# Patient Record
Sex: Female | Born: 1975 | Hispanic: No | Marital: Married | Smoking: Never smoker
Health system: Southern US, Community
[De-identification: ages and names within clinical notes are randomized; demographics above are authoritative.]

## PROBLEM LIST (undated history)

## (undated) DIAGNOSIS — N643 Galactorrhea not associated with childbirth: Secondary | ICD-10-CM

## (undated) DIAGNOSIS — R110 Nausea: Secondary | ICD-10-CM

## (undated) DIAGNOSIS — R42 Dizziness and giddiness: Secondary | ICD-10-CM

## (undated) DIAGNOSIS — Z87442 Personal history of urinary calculi: Secondary | ICD-10-CM

## (undated) DIAGNOSIS — R0602 Shortness of breath: Secondary | ICD-10-CM

## (undated) DIAGNOSIS — J45909 Unspecified asthma, uncomplicated: Secondary | ICD-10-CM

## (undated) DIAGNOSIS — D649 Anemia, unspecified: Secondary | ICD-10-CM

## (undated) DIAGNOSIS — R319 Hematuria, unspecified: Secondary | ICD-10-CM

## (undated) DIAGNOSIS — R87619 Unspecified abnormal cytological findings in specimens from cervix uteri: Secondary | ICD-10-CM

## (undated) DIAGNOSIS — N949 Unspecified condition associated with female genital organs and menstrual cycle: Secondary | ICD-10-CM

## (undated) DIAGNOSIS — L290 Pruritus ani: Secondary | ICD-10-CM

## (undated) DIAGNOSIS — E229 Hyperfunction of pituitary gland, unspecified: Secondary | ICD-10-CM

## (undated) DIAGNOSIS — IMO0002 Reserved for concepts with insufficient information to code with codable children: Secondary | ICD-10-CM

## (undated) HISTORY — DX: Personal history of urinary calculi: Z87.442

## (undated) HISTORY — DX: Nausea: R11.0

## (undated) HISTORY — DX: Unspecified asthma, uncomplicated: J45.909

## (undated) HISTORY — DX: Unspecified abnormal cytological findings in specimens from cervix uteri: R87.619

## (undated) HISTORY — DX: Anemia, unspecified: D64.9

## (undated) HISTORY — DX: Dizziness and giddiness: R42

## (undated) HISTORY — DX: Pruritus ani: L29.0

## (undated) HISTORY — DX: Hematuria, unspecified: R31.9

## (undated) HISTORY — DX: Hyperfunction of pituitary gland, unspecified: E22.9

## (undated) HISTORY — DX: Galactorrhea not associated with childbirth: N64.3

## (undated) HISTORY — DX: Reserved for concepts with insufficient information to code with codable children: IMO0002

## (undated) HISTORY — PX: DILATION AND CURETTAGE OF UTERUS: SHX78

## (undated) HISTORY — DX: Shortness of breath: R06.02

## (undated) HISTORY — DX: Unspecified condition associated with female genital organs and menstrual cycle: N94.9

---

## 2002-04-04 ENCOUNTER — Other Ambulatory Visit: Admission: RE | Admit: 2002-04-04 | Discharge: 2002-04-04 | Payer: Self-pay | Admitting: Obstetrics and Gynecology

## 2002-07-13 DIAGNOSIS — N643 Galactorrhea not associated with childbirth: Secondary | ICD-10-CM

## 2002-07-13 DIAGNOSIS — R7989 Other specified abnormal findings of blood chemistry: Secondary | ICD-10-CM

## 2002-07-13 HISTORY — DX: Galactorrhea not associated with childbirth: N64.3

## 2002-07-13 HISTORY — DX: Other specified abnormal findings of blood chemistry: R79.89

## 2002-07-29 ENCOUNTER — Emergency Department (HOSPITAL_COMMUNITY): Admission: EM | Admit: 2002-07-29 | Discharge: 2002-07-29 | Payer: Self-pay | Admitting: Emergency Medicine

## 2002-07-29 ENCOUNTER — Encounter: Payer: Self-pay | Admitting: Emergency Medicine

## 2002-08-03 ENCOUNTER — Ambulatory Visit (HOSPITAL_BASED_OUTPATIENT_CLINIC_OR_DEPARTMENT_OTHER): Admission: RE | Admit: 2002-08-03 | Discharge: 2002-08-03 | Payer: Self-pay | Admitting: Urology

## 2003-04-05 ENCOUNTER — Other Ambulatory Visit: Admission: RE | Admit: 2003-04-05 | Discharge: 2003-04-05 | Payer: Self-pay | Admitting: Obstetrics and Gynecology

## 2003-07-14 DIAGNOSIS — R42 Dizziness and giddiness: Secondary | ICD-10-CM

## 2003-07-14 DIAGNOSIS — R0602 Shortness of breath: Secondary | ICD-10-CM

## 2003-07-14 DIAGNOSIS — R110 Nausea: Secondary | ICD-10-CM

## 2003-07-14 HISTORY — DX: Dizziness and giddiness: R42

## 2003-07-14 HISTORY — DX: Nausea: R11.0

## 2003-07-14 HISTORY — DX: Shortness of breath: R06.02

## 2003-10-03 ENCOUNTER — Other Ambulatory Visit: Admission: RE | Admit: 2003-10-03 | Discharge: 2003-10-03 | Payer: Self-pay | Admitting: Obstetrics and Gynecology

## 2004-04-09 ENCOUNTER — Other Ambulatory Visit: Admission: RE | Admit: 2004-04-09 | Discharge: 2004-04-09 | Payer: Self-pay | Admitting: Obstetrics and Gynecology

## 2004-07-13 DIAGNOSIS — N9489 Other specified conditions associated with female genital organs and menstrual cycle: Secondary | ICD-10-CM

## 2004-07-13 DIAGNOSIS — N949 Unspecified condition associated with female genital organs and menstrual cycle: Secondary | ICD-10-CM

## 2004-07-13 HISTORY — DX: Unspecified condition associated with female genital organs and menstrual cycle: N94.9

## 2004-07-13 HISTORY — DX: Other specified conditions associated with female genital organs and menstrual cycle: N94.89

## 2005-04-15 ENCOUNTER — Other Ambulatory Visit: Admission: RE | Admit: 2005-04-15 | Discharge: 2005-04-15 | Payer: Self-pay | Admitting: Obstetrics and Gynecology

## 2005-06-19 ENCOUNTER — Ambulatory Visit: Payer: Self-pay | Admitting: Gastroenterology

## 2005-07-31 ENCOUNTER — Ambulatory Visit: Payer: Self-pay | Admitting: Cardiology

## 2006-04-27 ENCOUNTER — Other Ambulatory Visit: Admission: RE | Admit: 2006-04-27 | Discharge: 2006-04-27 | Payer: Self-pay | Admitting: Obstetrics and Gynecology

## 2007-01-23 IMAGING — CT CT ABDOMEN W/ CM
2 of 5 series · 16 of 46 positions shown, 18 images · IV contrast (APPLIED)
Comparison: Unenhanced CT abdomen and pelvis 07/29/2002.

CLINICAL DATA: Mid abdominal pain extending into the back for the past 7-8
months. History of urinary tract calculi. 17 pounds weight loss in one year.

CT ABDOMEN AND PELVIS WITH CONTRAST 07/31/2005:
TECHNIQUE: Multidetector helical CT of the abdomen and pelvis was performed
during bolus administration of intravenous contrast. Oral contrast was given.
Delayed imaging through the kidneys was performed.
Contrast:  100 cc Omnipaque 300.

[Series 2: abd_pel 5.0 b30f st · axial · 0.62mm/px · z∈[-422,-66]mm · 13 of 81 slices shown, 15 images]
[im 5/81  soft-tissue]
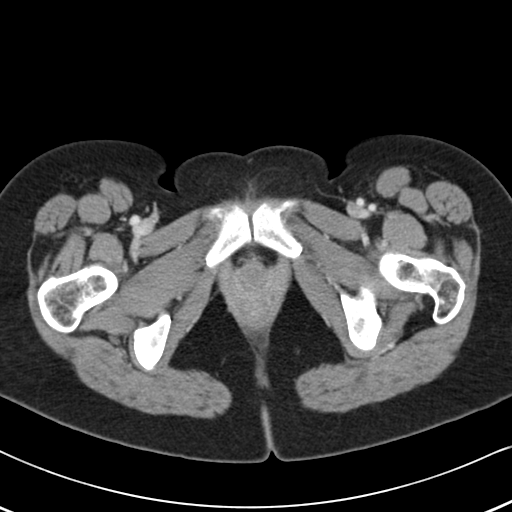
[im 5/81  bone]
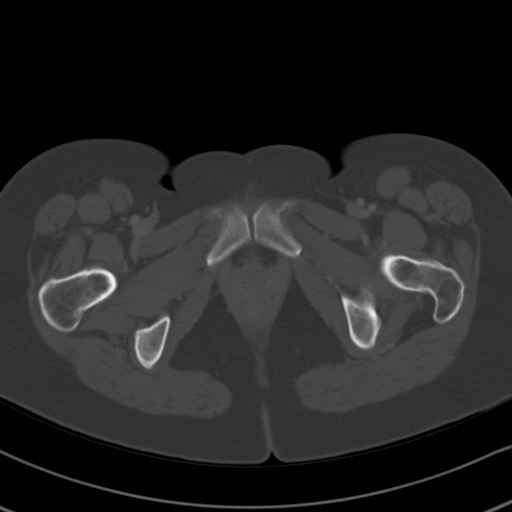
[im 13/81  soft-tissue]
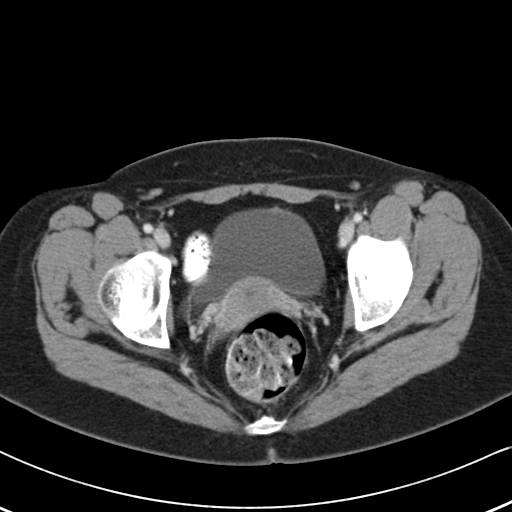
[im 17/81  soft-tissue]
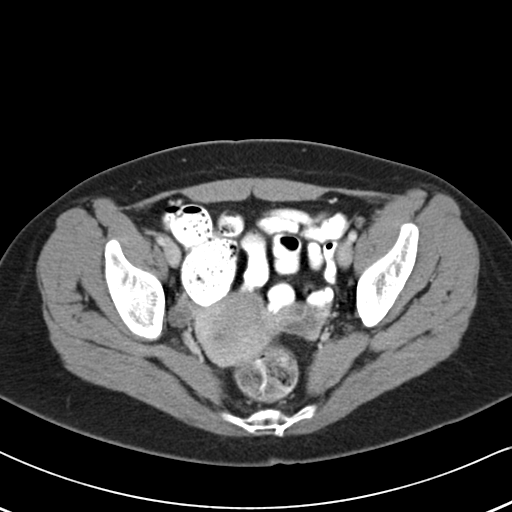
[im 22/81  soft-tissue]
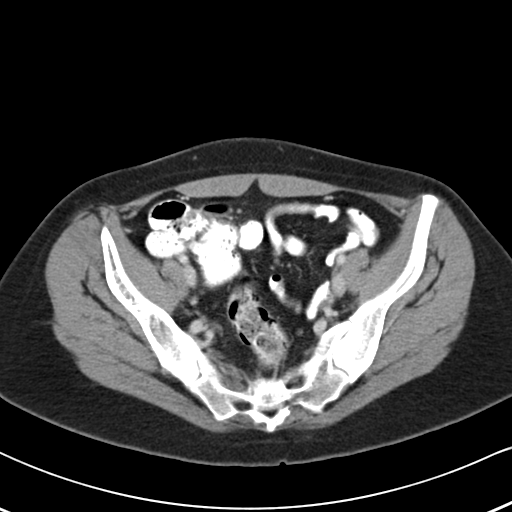
[im 30/81  soft-tissue]
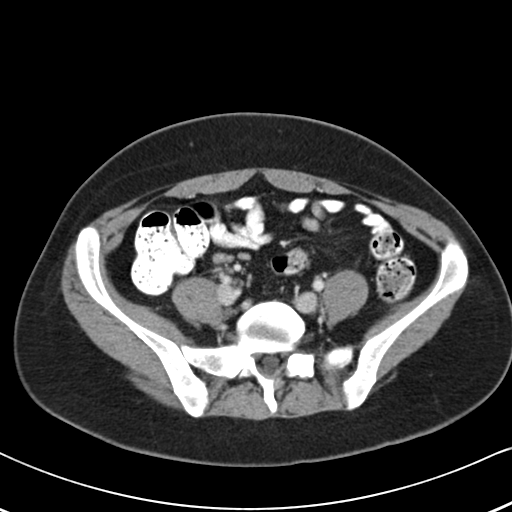
[im 34/81  soft-tissue]
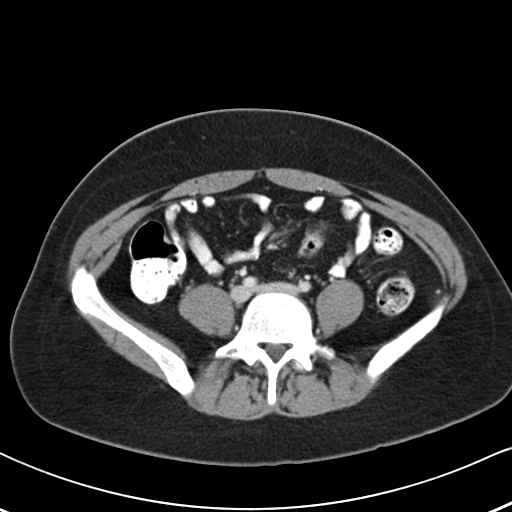
[im 43/81  soft-tissue]
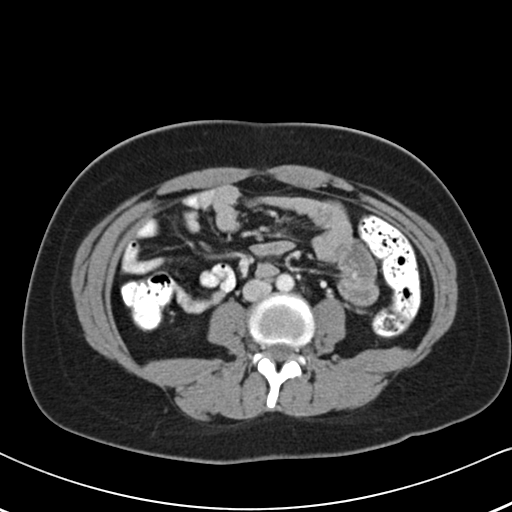
[im 47/81  soft-tissue]
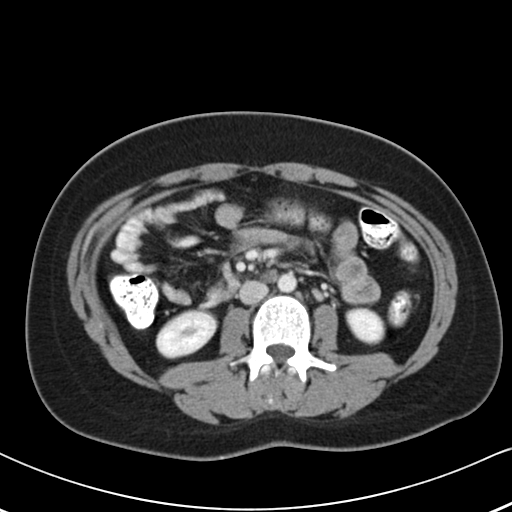
[im 51/81  soft-tissue]
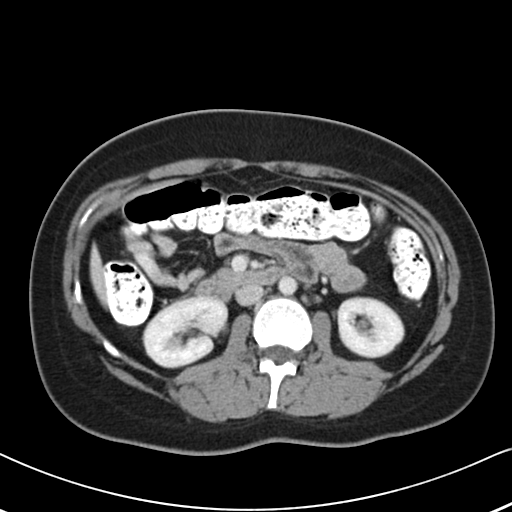
[im 51/81  bone]
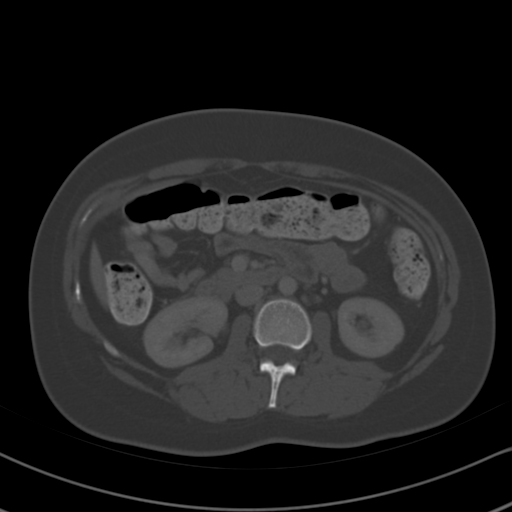
[im 59/81  soft-tissue]
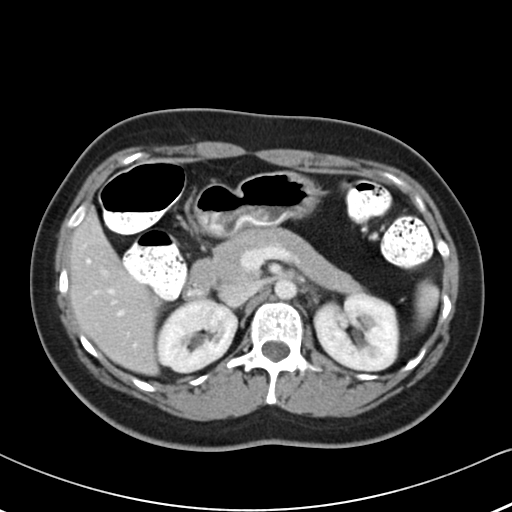
[im 64/81  soft-tissue]
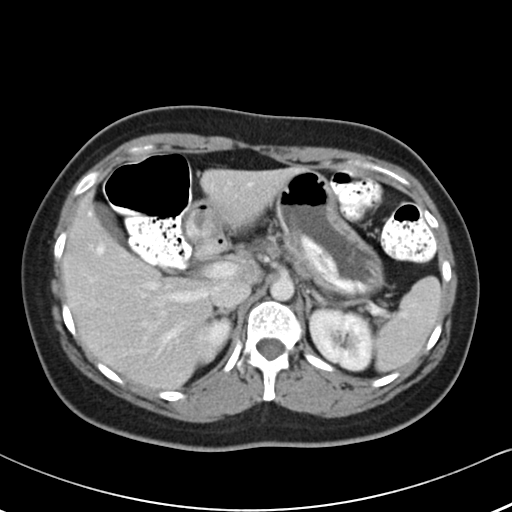
[im 68/81  soft-tissue]
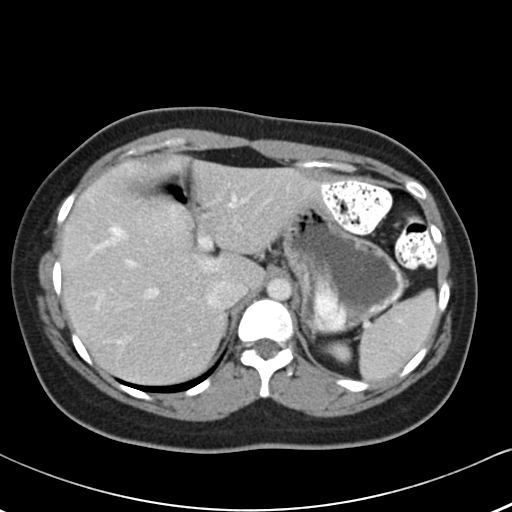
[im 76/81  soft-tissue]
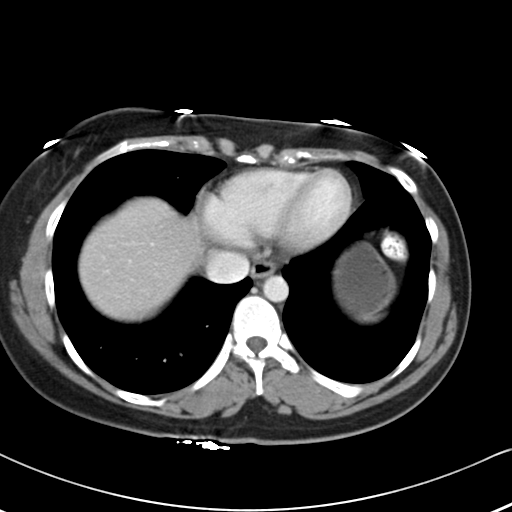

[Series 602: <mpr range> · coronal · 0.82mm/px · 3 of 39 slices shown]
[im 13/39  soft-tissue]
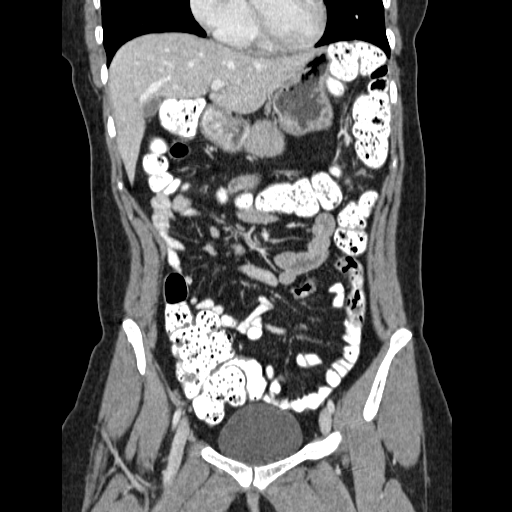
[im 17/39  soft-tissue]
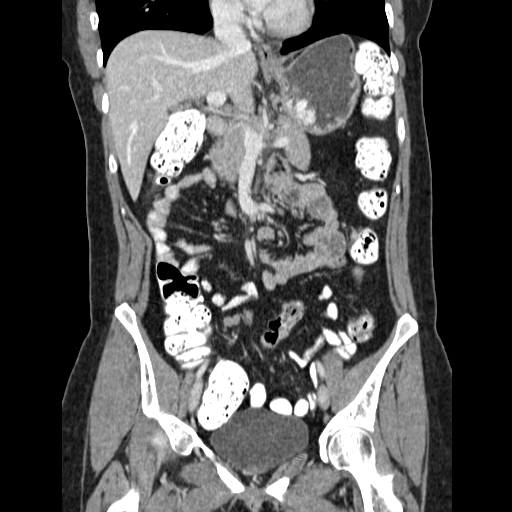
[im 22/39  soft-tissue]
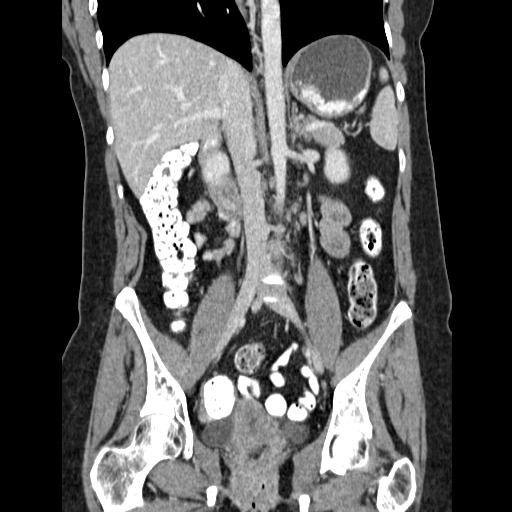

[16 of 46 positions shown; findings below may reference images not displayed]

CT ABDOMEN:

The liver is normal in size and contains an approximately 4 mm low attenuation
lesion in the posterior segment right lobe near its inferior tip; this likely
represents a small cyst, given its conspicuity for its small size. The spleen,
pancreas, and adrenal glands are normal. The gallbladder is contracted but
otherwise unremarkable and there is no biliary ductal dilation. No upper urinary
tract calculi are identified and both kidneys are normal in appearance. The
stomach and visualized colon and small bowel are unremarkable in the upper
abdomen. There is no free fluid. The abdominal aorta is normal in appearance.
There are scattered mildly enlarged retroperitoneal lymph nodes, the largest
measuring approximately 1.6 x 0.8 cm in the left periaortic region at the level
of the renal hilum. No significant lymphadenopathy is identified. The visualized
lung bases are clear.
IMPRESSION: No significant abnormalities. 4 mm lesion in the posterior segment right lobe of
liver likely a small cyst. Scattered subcentimeter short axis lymph nodes in the
retroperitoneum, nonspecific, likely reactive.

CT PELVIS:

The uterus is retroverted but otherwise normal in appearance. Bilateral ovarian
cysts are present consistent with age. No adnexal masses or free fluid are
identified. Urinary bladder is normal. The colon and small bowel are
unremarkable. There is no significant lymphadenopathy.
IMPRESSION: Normal CT of the pelvis.

## 2011-11-11 ENCOUNTER — Ambulatory Visit (INDEPENDENT_AMBULATORY_CARE_PROVIDER_SITE_OTHER): Payer: BC Managed Care – HMO | Admitting: Obstetrics and Gynecology

## 2011-11-11 ENCOUNTER — Encounter: Payer: Self-pay | Admitting: Obstetrics and Gynecology

## 2011-11-11 VITALS — BP 96/56 | Wt 138.0 lb

## 2011-11-11 DIAGNOSIS — Z87442 Personal history of urinary calculi: Secondary | ICD-10-CM

## 2011-11-11 DIAGNOSIS — IMO0001 Reserved for inherently not codable concepts without codable children: Secondary | ICD-10-CM | POA: Insufficient documentation

## 2011-11-11 DIAGNOSIS — J45909 Unspecified asthma, uncomplicated: Secondary | ICD-10-CM

## 2011-11-11 DIAGNOSIS — Z309 Encounter for contraceptive management, unspecified: Secondary | ICD-10-CM

## 2011-11-11 DIAGNOSIS — R109 Unspecified abdominal pain: Secondary | ICD-10-CM

## 2011-11-11 DIAGNOSIS — D649 Anemia, unspecified: Secondary | ICD-10-CM | POA: Insufficient documentation

## 2011-11-11 LAB — CBC
HCT: 39.2 % (ref 36.0–46.0)
Hemoglobin: 12.1 g/dL (ref 12.0–15.0)
MCH: 26.1 pg (ref 26.0–34.0)
MCHC: 30.9 g/dL (ref 30.0–36.0)
MCV: 84.5 fL (ref 78.0–100.0)
Platelets: 361 10*3/uL (ref 150–400)
RBC: 4.64 MIL/uL (ref 3.87–5.11)
RDW: 14.9 % (ref 11.5–15.5)
WBC: 14.2 10*3/uL — ABNORMAL HIGH (ref 4.0–10.5)

## 2011-11-11 LAB — POCT URINALYSIS DIPSTICK
Bilirubin, UA: NEGATIVE
Blood, UA: NEGATIVE
Glucose, UA: NEGATIVE
Ketones, UA: NEGATIVE
Leukocytes, UA: NEGATIVE
Nitrite, UA: NEGATIVE
Protein, UA: NEGATIVE
Spec Grav, UA: <=1.005
Urobilinogen, UA: NEGATIVE
pH, UA: 8

## 2011-11-11 MED ORDER — NORETHINDRONE ACET-ETHINYL EST 1-20 MG-MCG PO TABS: 1.0000 | ORAL_TABLET | Freq: Every day | ORAL | Status: DC

## 2011-11-11 NOTE — Progress Notes (Deleted)
[name] is a 37 y.o. year old female,No obstetric history on file., who presents for a problem visit.  Subjective:  ***  Objective:  BP 96/56  Wt 138 lb (62.596 kg)  LMP 10/20/2011   {Physical ZOXW:9604540}  {Exam; pelvic:30843}  Assessment:  ***  Plan:  ***  Return to office {Return to clinic:15760}   Leonard Schwartz M.D.  11/11/2011 9:58 AM

## 2011-11-11 NOTE — Progress Notes (Signed)
[name] is a 36 y.o. year old female,No obstetric history on file., who presents for a problem visit.  Subjective:  The patient presents complaining of a vague abdominal discomfort.  Her GU function and GI function are normal at this point.  She has a new significant other.  She is not sexually active.  She does want to discuss contraception.  She does want to have children in the future.  She has a history of anemia and is currently taking iron.  Objective:  BP 96/56  Wt 138 lb (62.596 kg)  LMP 10/20/2011   UA: Negative  Exam deferred.  Assessment:  History of anemia  Take abdominal pain  Contraception  Plan:  CBC sent.  Contraceptive options reviewed.  Risk and benefits discussed.  The patient was to began Loestrin oral contraceptives 1.0/20.  Return to office prn if symptoms worsen or fail to improve. Return for annual exam as long as she is doing well.  Call for questions or concerns.   Leonard Schwartz M.D.  11/11/2011 7:32 PM

## 2011-11-11 NOTE — Progress Notes (Signed)
Addended by: Janine Limbo on: 11/11/2011 07:39 PM   Modules accepted: Orders

## 2011-11-13 LAB — URINE CULTURE: Colony Count: 8000

## 2012-02-15 ENCOUNTER — Encounter: Payer: BC Managed Care – HMO | Admitting: Obstetrics and Gynecology

## 2012-04-05 ENCOUNTER — Ambulatory Visit (INDEPENDENT_AMBULATORY_CARE_PROVIDER_SITE_OTHER): Payer: BC Managed Care – HMO | Admitting: Obstetrics and Gynecology

## 2012-04-05 ENCOUNTER — Encounter: Payer: Self-pay | Admitting: Obstetrics and Gynecology

## 2012-04-05 VITALS — BP 98/70 | Resp 14 | Ht 62.0 in | Wt 142.0 lb

## 2012-04-05 DIAGNOSIS — R350 Frequency of micturition: Secondary | ICD-10-CM

## 2012-04-05 DIAGNOSIS — N898 Other specified noninflammatory disorders of vagina: Secondary | ICD-10-CM

## 2012-04-05 DIAGNOSIS — N949 Unspecified condition associated with female genital organs and menstrual cycle: Secondary | ICD-10-CM

## 2012-04-05 MED ORDER — SULFAMETHOXAZOLE-TMP DS 800-160 MG PO TABS: 1.0000 | ORAL_TABLET | Freq: Two times a day (BID) | ORAL | Status: DC

## 2012-04-05 MED ORDER — FLUCONAZOLE 150 MG PO TABS: 150.0000 mg | ORAL_TABLET | Freq: Every day | ORAL | Status: DC

## 2012-04-05 NOTE — Progress Notes (Signed)
HISTORY OF PRESENT ILLNESS  Ms. Janiqua Friscia is a 36 y.o. year old female,G0P0, who presents for a problem visit. The patient complains of urinary frequency.  She denies dysuria and hematuria.  She does have a history of kidney stones.  She has started birth control pills and seems to be doing well.  She is recently married.  Her hemoglobin was normal in May 2013.  Her white blood cell count was greater than 14,000.  She does have a history of sinus allergies.  Subjective:  The patient reports that she has had difficulty with sinus congestion.  She frequently gets a yeast infection after taking antibiotics.  She complains of "passing gas" from the vagina.  She complains of a vaginal odor.  There is no discharge.  She declined a pelvic exam.  Objective:  BP 98/70  Resp 14  Ht 5\' 2"  (1.575 m)  Wt 142 lb (64.411 kg)  BMI 25.97 kg/m2  LMP 03/30/2012   General: no distress Resp: clear to auscultation bilaterally Cardio: regular rate and rhythm, S1, S2 normal, no murmur, click, rub or gallop GI: soft and nontender HEENT: Congested sinuses.  Throat is clear.  Neck is nontender.  No adenopathy.  Exam deferred.  Assessment:  Urinary frequency Sinus congestion Seasonal allergies Elevated white blood cell count "Passing gas" and odor from the vagina  Plan:  The patient declines a pelvic exam today.  She will observe only for now. Septra DS, one tablet twice each day for 3 days. Diflucan 150 mg, one tablet if needed for yeast infection. If the patient's seasonal allergies have subsided, then we will repeat her white blood cell count at her next visit.  Return to office in 2 month(s).   Leonard Schwartz M.D.  04/05/2012 6:17 PM

## 2012-04-27 ENCOUNTER — Other Ambulatory Visit: Payer: Self-pay | Admitting: Obstetrics and Gynecology

## 2012-05-17 ENCOUNTER — Ambulatory Visit (INDEPENDENT_AMBULATORY_CARE_PROVIDER_SITE_OTHER): Payer: BC Managed Care – HMO | Admitting: Obstetrics and Gynecology

## 2012-05-17 ENCOUNTER — Encounter: Payer: Self-pay | Admitting: Obstetrics and Gynecology

## 2012-05-17 VITALS — BP 90/60 | Wt 144.0 lb

## 2012-05-17 DIAGNOSIS — N390 Urinary tract infection, site not specified: Secondary | ICD-10-CM

## 2012-05-17 DIAGNOSIS — IMO0002 Reserved for concepts with insufficient information to code with codable children: Secondary | ICD-10-CM

## 2012-05-17 DIAGNOSIS — D75839 Thrombocytosis, unspecified: Secondary | ICD-10-CM

## 2012-05-17 DIAGNOSIS — D473 Essential (hemorrhagic) thrombocythemia: Secondary | ICD-10-CM

## 2012-05-17 DIAGNOSIS — R102 Pelvic and perineal pain: Secondary | ICD-10-CM

## 2012-05-17 DIAGNOSIS — N949 Unspecified condition associated with female genital organs and menstrual cycle: Secondary | ICD-10-CM

## 2012-05-17 LAB — POCT URINALYSIS DIPSTICK
Glucose, UA: NEGATIVE
Nitrite, UA: NEGATIVE
Protein, UA: NEGATIVE
Urobilinogen, UA: NEGATIVE

## 2012-05-17 LAB — CBC
MCH: 27.1 pg (ref 26.0–34.0)
Platelets: 381 10*3/uL (ref 150–400)
RBC: 4.35 MIL/uL (ref 3.87–5.11)
RDW: 13.8 % (ref 11.5–15.5)
WBC: 14.8 10*3/uL — ABNORMAL HIGH (ref 4.0–10.5)

## 2012-05-17 MED ORDER — PHENAZOPYRIDINE HCL 200 MG PO TABS: 200.0000 mg | ORAL_TABLET | Freq: Three times a day (TID) | ORAL | Status: DC | PRN

## 2012-05-17 NOTE — Addendum Note (Signed)
Addended by: Loralyn Freshwater on: 05/17/2012 05:31 PM   Modules accepted: Orders

## 2012-05-17 NOTE — Progress Notes (Signed)
HISTORY OF PRESENT ILLNESS  [name] is a 36 y.o. year old female,G0P0, who presents for a problem visit. The patient has recently married.  She complains of dysuria and dyspareunia.  She was treated with Septra DS for her discomfort continues.  The patient also complains of "gas" from the vagina not associated with movements or intercourse.  The patient had an elevated white blood cell count of uncertain etiology.  Subjective:  See above  Objective:  BP 90/60  Wt 144 lb (65.318 kg)  LMP 04/27/2012   General: no distress GI: nontender, no masses  External genitalia: normal general appearance Vaginal: tender anterior vagina at the bladder Cervix: normal appearance Adnexa: normal bimanual exam Uterus: normal size, nontender  UA: Negative  Assessment:  Dysuria  Dyspareunia  Thrombocytosis  "Gas" from the vagina of uncertain etiology  Plan:  Urine culture.  CBC.  The patient will try Pyridium to see if that makes her bladder filled better.  I will call the patient with her laboratory tests results and discuss prophylactic nitrofurantoin.  Return to office prn if symptoms worsen or fail to improve.   Leonard Schwartz M.D.  05/17/2012 5:24 PM

## 2012-05-19 LAB — URINE CULTURE: Colony Count: NO GROWTH

## 2012-06-20 ENCOUNTER — Encounter: Payer: Self-pay | Admitting: Obstetrics and Gynecology

## 2012-06-20 ENCOUNTER — Ambulatory Visit (INDEPENDENT_AMBULATORY_CARE_PROVIDER_SITE_OTHER): Payer: BC Managed Care – HMO | Admitting: Obstetrics and Gynecology

## 2012-06-20 ENCOUNTER — Telehealth: Payer: Self-pay

## 2012-06-20 VITALS — Ht 63.0 in | Wt 140.0 lb

## 2012-06-20 DIAGNOSIS — Z01419 Encounter for gynecological examination (general) (routine) without abnormal findings: Secondary | ICD-10-CM

## 2012-06-20 DIAGNOSIS — Z124 Encounter for screening for malignant neoplasm of cervix: Secondary | ICD-10-CM

## 2012-06-20 MED ORDER — NORETHIN ACE-ETH ESTRAD-FE 1-20 MG-MCG PO TABS: 1.0000 | ORAL_TABLET | Freq: Every day | ORAL | Status: DC

## 2012-06-20 NOTE — Progress Notes (Signed)
ANNUAL GYNECOLOGIC EXAMINATION   Brittany Thornton is a 36 y.o. female, G0P0, who presents for an annual exam. The patient has a history of dyspareunia that is getting better.  She is recently married.  She has a history of kidney stones but is having no problems.  She may want to get pregnant in the next couple of months.   History   Social History  . Marital Status: Married    Spouse Name: N/A    Number of Children: N/A  . Years of Education: N/A   Social History Main Topics  . Smoking status: Never Smoker   . Smokeless tobacco: Never Used  . Alcohol Use: None  . Drug Use: No  . Sexually Active: Yes    Birth Control/ Protection: Pill   Other Topics Concern  . None   Social History Narrative  . None    Menstrual cycle:   LMP: Patient's last menstrual period was 05/27/2012.             The following portions of the patient's history were reviewed and updated as appropriate: allergies, current medications, past family history, past medical history, past social history, past surgical history and problem list.  Review of Systems Pertinent items are noted in HPI. Breast:Negative for breast lump,nipple discharge or nipple retraction Gastrointestinal: Negative for abdominal pain, change in bowel habits or rectal bleeding Urinary:negative   Objective:    Ht 5\' 3"  (1.6 m)  Wt 140 lb (63.504 kg)  BMI 24.80 kg/m2  LMP 05/27/2012    Weight:  Wt Readings from Last 1 Encounters:  06/20/12 140 lb (63.504 kg)          BMI: Body mass index is 24.80 kg/(m^2).  General Appearance: Alert, appropriate appearance for age. No acute distress HEENT: Grossly normal Neck / Thyroid: Supple, no masses, nodes or enlargement Lungs: clear to auscultation bilaterally Back: No CVA tenderness Breast Exam: No masses or nodes.No dimpling, nipple retraction or discharge. Cardiovascular: Regular rate and rhythm. S1, S2, no murmur Gastrointestinal: Soft, non-tender, no masses or  organomegaly  ++++++++++++++++++++++++++++++++++++++++++++++++++++++++  Pelvic Exam: External genitalia: normal general appearance Vaginal: normal without tenderness, induration or masses. Relaxation: No Cervix: normal appearance Adnexa: normal bimanual exam Uterus: normal size, shape, and consistency Rectovaginal: normal rectal, no masses  ++++++++++++++++++++++++++++++++++++++++++++++++++++++++  Lymphatic Exam: Non-palpable nodes in neck, clavicular, axillary, or inguinal regions Neurologic: Normal speech, no tremor  Psychiatric: Alert and oriented, appropriate affect.  Assessment:    Normal gyn exam   Overweight or obese: Yes   Pelvic relaxation: No  Dyspareunia that is getting better  History of kidney stones   Plan:    pap smear return annually or prn Contraception:oral contraceptives (estrogen/progesterone)    Medications prescribed: Begin prenatal vitamins  STD screen request: No   The updated Pap smear screening guidelines were discussed with the patient. The patient requested that I obtain a Pap smear: Yes.  Kegel exercises discussed: No.  Proper diet and regular exercise were reviewed.  Annual mammograms recommended starting at age 8. Proper breast care was discussed.  Screening colonoscopy is recommended beginning at age 87.  Regular health maintenance was reviewed.  Sleep hygiene was discussed.  Adequate calcium and vitamin D intake was emphasized.  Leonard Schwartz M.D.    Regular Periods: yes Mammogram: no  Monthly Breast Ex.: yes Exercise: yes  Tetanus < 10 years: Unsure Seatbelts: yes  NI. Bladder Functn.: yes Abuse at home: no  Daily BM's: yes Stressful Work: no  Healthy Diet:  yes Sigmoid-Colonoscopy: n/a  Calcium: no Medical problems this year: none   LAST PAP: 1103/2010  Contraception: Junel FE  Mammogram:  N/a  PCP: Deatra James  PMH: None  FMH: None  Last Bone Scan: None

## 2012-06-21 LAB — PAP IG, CT-NG, RFX HPV ASCU

## 2012-06-21 NOTE — Telephone Encounter (Signed)
error 

## 2013-05-16 ENCOUNTER — Encounter: Payer: Self-pay | Admitting: Internal Medicine

## 2013-05-16 ENCOUNTER — Ambulatory Visit (INDEPENDENT_AMBULATORY_CARE_PROVIDER_SITE_OTHER): Payer: BC Managed Care – PPO | Admitting: Internal Medicine

## 2013-05-16 VITALS — BP 118/78 | HR 72 | Temp 98.1°F | Ht 62.0 in | Wt 143.2 lb

## 2013-05-16 DIAGNOSIS — J45909 Unspecified asthma, uncomplicated: Secondary | ICD-10-CM

## 2013-05-16 DIAGNOSIS — R942 Abnormal results of pulmonary function studies: Secondary | ICD-10-CM

## 2013-05-16 NOTE — Progress Notes (Signed)
Subjective:    Patient ID: Brittany Thornton, female    DOB: [birth year]   MRN: [id number]  HPI   54 yobf from [place] went to college in [place] where noted onset in her 56s of variable cough productive of green mucus at time and poor ex tolerance which persisted ever since referred Dr Katrinka Blazing because of abdnormal pft's ? Restrictive / ? Also whether has asthma  05/16/2013 [facility] office visit/ Nami Strawder cc variable cough worse spring and fall and although "I've always had it" does not have it at ov and not taking meds for asthma for over a month s flare.   Doe x jogging.     Symptoms are some better on nasonex asthmanex but never completely relieved (except doen't have it now and has not had it for a month)  After about 15 min of not following the words "always" and " constant"  It turns out that most of her respiratory  problems are purely seasonal, have already been evaluated by [facility], and the only resp  symptom that is not seasonal is the doe jogging to which I asked "when did you last jog, is this something you do regularly?" she responded "I never do"  No obvious day to day or daytime variabilty or assoc chronic cough or cp or chest tightness, subjective wheeze overt sinus or hb symptoms. No unusual exp hx or h/o childhood pna/ asthma or knowledge of premature birth.  Sleeping ok without nocturnal  or early am exacerbation  of respiratory  c/o's or need for noct saba. Also denies any obvious fluctuation of symptoms with weather or environmental changes or other aggravating or alleviating factors except as outlined above   Current Medications, Allergies, Complete Past Medical History, Past Surgical History, Family History, and Social History were reviewed in [employer] record.          Review of Systems  Constitutional: Negative for fever, chills and unexpected weight change.  HENT: Positive for congestion, sneezing and sore throat. Negative for dental  problem, ear pain, nosebleeds, postnasal drip, rhinorrhea, sinus pressure, trouble swallowing and voice change.   Eyes: Negative for visual disturbance.  Respiratory: Positive for cough and shortness of breath. Negative for choking.   Cardiovascular: Negative for chest pain and leg swelling.  Gastrointestinal: Negative for vomiting, abdominal pain and diarrhea.  Genitourinary: Negative for difficulty urinating.  Musculoskeletal: Negative for arthralgias.  Skin: Negative for rash.  Neurological: Positive for headaches. Negative for tremors and syncope.  Hematological: Does not bruise/bleed easily.       Objective:   Physical Exam  amb bf nad  Quite pleasant but  failed to answer a single question asked in a straightforward manner, tending  answer questions with ambiguous medical terms or diagnoses and seemed aggravated  when asked the same question more than once for clarification "I've never been asked so many questions by a doctor"   HEENT: nl dentition, turbinates, and orophanx. Nl external ear canals without cough reflex   NECK :  without JVD/Nodes/TM/ nl carotid upstrokes bilaterally   LUNGS: no acc muscle use, clear to A and P bilaterally without cough on insp or exp maneuvers   CV:  RRR  no s3 or murmur or increase in P2, no edema   ABD:  soft and nontender with nl excursion in the supine position. No bruits or organomegaly, bowel sounds nl  MS:  warm without deformities, calf tenderness, cyanosis or clubbing  SKIN: warm and dry without lesions  NEURO:  alert, approp, no deficits     Spirometry 04/28/13  No airflow obstruction at all (pt reports this was off asthmanex and albuterol and while feelling fine, though ov notes suggest otherwise     Assessment & Plan:

## 2013-05-16 NOTE — Patient Instructions (Signed)
Your function is normal when obtained on a day when you had no symptoms so you don't  have a permanent lung problem  Your response to inhalers suggests asthma but it should be easier to control than what you are describing suggesting you might need a different asthma medicaiton  The asthmanex is a good choice but takes weeks to work - a better option might be dulera 100 dosed 2 puffs every 12 hours during your bad seasons  Keep the appointment with Dr Brittany Thornton and pulmonary follow up can be as needed

## 2013-05-16 NOTE — Assessment & Plan Note (Signed)
She has very minimal restrictive changes c/w body habitus, so no need for further w/u

## 2013-05-16 NOTE — Assessment & Plan Note (Addendum)
This is an exceptionally challenging pt who probably does have asthma that is seasonal and manifested by cough and using asthmanex prn, not maintenance.  It is so difficult to get an accurate history that I can't be sure  That what   she's describing as a seasonal cough "she always has" represents asthma but it does seem some better on asthmanex.  Because the asthmanex is so slow to start working and she is not likely to be convinced to take as maint rx a good alternative would be the use of seasonal dulera 100 dosed 2 every 12 hours but I would defer rx and f/u to Dr Lucie Leather  With whom she already has a scheduled appt this month.  Unless she has persistent airflow obst (not present on pft's from 04/28/13) or a specific desired activity where breathing limits her, there is no reason for pulmonary f/u.  She seemed very irritated with all the questions I asked her so I explained to her:  Unlike when you get a prescription for eyeglasses, it's not possible to always walk out of this or any medical office with a perfect prescription that is immediately effective  based on any test that we offer here.    On the contrary, it may take several weeks for the full impact of changes recommened - hopefully you will respond well.  If not, then your doctor can adjust your medication on your next visit accordingly, knowing more then than we can possibly know now.

## 2015-03-23 DIAGNOSIS — J309 Allergic rhinitis, unspecified: Secondary | ICD-10-CM

## 2015-03-23 DIAGNOSIS — T781XXA Other adverse food reactions, not elsewhere classified, initial encounter: Secondary | ICD-10-CM | POA: Insufficient documentation

## 2015-03-23 DIAGNOSIS — T7819XA Other adverse food reactions, not elsewhere classified, initial encounter: Secondary | ICD-10-CM | POA: Insufficient documentation

## 2015-03-23 DIAGNOSIS — T781XXS Other adverse food reactions, not elsewhere classified, sequela: Secondary | ICD-10-CM

## 2015-04-09 ENCOUNTER — Ambulatory Visit (INDEPENDENT_AMBULATORY_CARE_PROVIDER_SITE_OTHER): Payer: BC Managed Care – PPO

## 2015-04-09 DIAGNOSIS — J309 Allergic rhinitis, unspecified: Secondary | ICD-10-CM | POA: Diagnosis not present

## 2015-04-16 ENCOUNTER — Ambulatory Visit (INDEPENDENT_AMBULATORY_CARE_PROVIDER_SITE_OTHER): Payer: BC Managed Care – PPO | Admitting: *Deleted

## 2015-04-16 DIAGNOSIS — J309 Allergic rhinitis, unspecified: Secondary | ICD-10-CM | POA: Diagnosis not present

## 2015-04-26 ENCOUNTER — Ambulatory Visit (INDEPENDENT_AMBULATORY_CARE_PROVIDER_SITE_OTHER): Payer: BC Managed Care – PPO | Admitting: Neurology

## 2015-04-26 DIAGNOSIS — J309 Allergic rhinitis, unspecified: Secondary | ICD-10-CM

## 2015-05-07 ENCOUNTER — Ambulatory Visit (INDEPENDENT_AMBULATORY_CARE_PROVIDER_SITE_OTHER): Payer: BC Managed Care – PPO

## 2015-05-07 DIAGNOSIS — J309 Allergic rhinitis, unspecified: Secondary | ICD-10-CM | POA: Diagnosis not present

## 2015-05-21 ENCOUNTER — Ambulatory Visit (INDEPENDENT_AMBULATORY_CARE_PROVIDER_SITE_OTHER): Payer: BC Managed Care – PPO

## 2015-05-21 DIAGNOSIS — J309 Allergic rhinitis, unspecified: Secondary | ICD-10-CM

## 2015-05-24 DIAGNOSIS — J301 Allergic rhinitis due to pollen: Secondary | ICD-10-CM | POA: Diagnosis not present

## 2015-05-31 ENCOUNTER — Ambulatory Visit (INDEPENDENT_AMBULATORY_CARE_PROVIDER_SITE_OTHER): Payer: BC Managed Care – PPO | Admitting: *Deleted

## 2015-05-31 DIAGNOSIS — J309 Allergic rhinitis, unspecified: Secondary | ICD-10-CM

## 2015-06-04 ENCOUNTER — Encounter: Payer: Self-pay | Admitting: Allergy and Immunology

## 2015-06-04 ENCOUNTER — Ambulatory Visit (INDEPENDENT_AMBULATORY_CARE_PROVIDER_SITE_OTHER): Payer: BC Managed Care – PPO | Admitting: Allergy and Immunology

## 2015-06-04 VITALS — BP 102/78 | HR 72 | Resp 16 | Ht 61.61 in | Wt 146.8 lb

## 2015-06-04 DIAGNOSIS — H1010 Acute atopic conjunctivitis, unspecified eye: Secondary | ICD-10-CM

## 2015-06-04 DIAGNOSIS — J4530 Mild persistent asthma, uncomplicated: Secondary | ICD-10-CM

## 2015-06-04 DIAGNOSIS — Z91018 Allergy to other foods: Secondary | ICD-10-CM | POA: Diagnosis not present

## 2015-06-04 DIAGNOSIS — J309 Allergic rhinitis, unspecified: Secondary | ICD-10-CM

## 2015-06-04 NOTE — Progress Notes (Signed)
[facility]  Follow-up Note  Refering Provider: Donald Prose, MD Primary Provider: Lynne Logan, MD  Subjective:   Brittany Thornton is a 39 y.o. female who [facility] in re-evaluation of the following:  HPI Comments:  Brittany Thornton returns to this clinic on 06/04/2015 in reevaluation of her asthma and allergic rhinoconjunctivitis treated with immunotherapy and oral pollinosis and food allergy syndrome. She is done quite well since I last seen her in his clinic in August. She's had no exacerbations of her asthma and rarely uses any short acting bronchodilator and can exercise without any difficulty while continuing to use Qvar 42 inhalations once a day. Her nose is not been causing her any problems. She's been eating peanut without any difficulty ever since she had her peanut challenge back in August. She still remains away from most fruits.   Outpatient Encounter Prescriptions as of 06/04/2015  Medication Sig  . beclomethasone (QVAR) 40 MCG/ACT inhaler Inhale 2 puffs into the lungs daily.  . cetirizine (ZYRTEC) 10 MG tablet Take 10 mg by mouth daily.  Marland Kitchen EPINEPHrine (EPIPEN 2-PAK) 0.3 mg/0.3 mL IJ SOAJ injection Inject 0.3 mg into the muscle as needed.  . levalbuterol (XOPENEX HFA) 45 MCG/ACT inhaler Inhale 1-2 puffs into the lungs every 6 (six) hours as needed for wheezing.  . [DISCONTINUED] mometasone (ASMANEX) 220 MCG/INH inhaler Inhale 1 puff into the lungs daily.    No facility-administered encounter medications on file as of 06/04/2015.    No orders of the defined types were placed in this encounter.    Past Medical History  Diagnosis Date  . Rectal itching   . Dyspareunia   . History of kidney stones   . Hematuria   . Asthma   . Vaginal burning 2006  . Dizziness 2005  . Abnormal Pap smear     ASCUS  . SOB (shortness of breath) 2005  . Nausea 2005  . Dizziness 2005  . Elevated prolactin  level (Brittany Thornton) 2004  . Galactorrhea 2004  . Anemia     No past surgical history on file.  Allergies  Allergen Reactions  . Codeine   . Other Itching and Swelling    Oral pollinosis - fruit  . Oxycodone     Review of Systems  Constitutional: Negative.   HENT: Negative.   Eyes: Negative.   Respiratory: Negative.   Cardiovascular: Negative.   Gastrointestinal: Negative.   Musculoskeletal: Positive for back pain (Chronic lower back pain addressed by chiropractor).  Skin: Negative.   Neurological: Positive for dizziness (Two-week episode of dizziness September 2016 resolved).     Objective:   Filed Vitals:   06/04/15 1039  BP: 102/78  Pulse: 72  Resp: 16   Height: 5' 1.61" (156.5 cm)  Weight: 146 lb 13.2 oz (66.6 kg)   Physical Exam  Constitutional: She appears well-developed and well-nourished. No distress.  HENT:  Head: Normocephalic and atraumatic. Head is without right periorbital erythema and without left periorbital erythema.  Right Ear: Tympanic membrane, external ear and ear canal normal. No drainage or tenderness. No foreign bodies. Tympanic membrane is not injected, not scarred, not perforated, not erythematous, not retracted and not bulging. No middle ear effusion.  Left Ear: Tympanic membrane, external ear and ear canal normal. No drainage or tenderness. No foreign bodies. Tympanic membrane is not injected, not scarred, not perforated, not erythematous, not retracted and not bulging.  No middle ear effusion.  Nose:  Nose normal. No mucosal edema, rhinorrhea, nose lacerations or sinus tenderness.  No foreign bodies.  Mouth/Throat: Oropharynx is clear and moist. No oropharyngeal exudate, posterior oropharyngeal edema, posterior oropharyngeal erythema or tonsillar abscesses.  Eyes: Lids are normal. Right eye exhibits no chemosis, no discharge and no exudate. No foreign body present in the right eye. Left eye exhibits no chemosis, no discharge and no exudate. No foreign  body present in the left eye. Right conjunctiva is not injected. Left conjunctiva is not injected.  Neck: Neck supple. No tracheal tenderness present. No tracheal deviation and no edema present. No thyroid mass and no thyromegaly present.  Cardiovascular: Normal rate, regular rhythm, S1 normal and S2 normal.  Exam reveals no gallop.   No murmur heard. Pulmonary/Chest: No accessory muscle usage or stridor. No respiratory distress. She has no wheezes. She has no rhonchi. She has no rales.  Abdominal: Soft.  Lymphadenopathy:       Head (right side): No tonsillar adenopathy present.       Head (left side): No tonsillar adenopathy present.    She has no cervical adenopathy.  Neurological: She is alert.  Skin: No rash noted. She is not diaphoretic.  Psychiatric: She has a normal mood and affect. Her behavior is normal.    Diagnostics:    Spirometry was performed and demonstrated an FEV1 of 1.97 at 70 % of predicted.  The patient had an Asthma Control Test with the following results: ACT Total Score: 24.    Assessment and Plan:   1. Mild persistent asthma, uncomplicated   2. Allergic rhinoconjunctivitis   3. Food allergy      1. Have husband get flu vaccine.  2. Continue Qvar 40 two inhalations one time per day. Increase to three inhalations three times per day during 'flare up'  3. Continue immunotherapy and Epi-Pen  4. Continue zyrtec and xopenex hfa if needed.   5. Return in 6 months or earlier if problem.   Brittany Thornton is done quite well and we will continue to have her use therapy mentioned above and see her back in this clinic in 6 months or earlier if there is problem. 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   Brittany Katz, MD [place]

## 2015-06-04 NOTE — Patient Instructions (Addendum)
  1. Have husband get flu vaccine.  2. Continue Qvar 40 two inhalations one time per day. Increase to three inhalations three times per day during 'flare up'  3. Continue immunotherapy and Epi-Pen  4. Continue zyrtec and xopenex hfa if needed.   Return in 6 months or earlier if problem.

## 2015-06-11 ENCOUNTER — Ambulatory Visit (INDEPENDENT_AMBULATORY_CARE_PROVIDER_SITE_OTHER): Payer: BC Managed Care – PPO | Admitting: *Deleted

## 2015-06-11 DIAGNOSIS — J309 Allergic rhinitis, unspecified: Secondary | ICD-10-CM | POA: Diagnosis not present

## 2015-06-11 NOTE — Progress Notes (Signed)
This encounter was created in error - please disregard.

## 2015-07-04 ENCOUNTER — Ambulatory Visit (INDEPENDENT_AMBULATORY_CARE_PROVIDER_SITE_OTHER): Payer: BC Managed Care – PPO

## 2015-07-04 DIAGNOSIS — J309 Allergic rhinitis, unspecified: Secondary | ICD-10-CM

## 2015-07-16 ENCOUNTER — Ambulatory Visit (INDEPENDENT_AMBULATORY_CARE_PROVIDER_SITE_OTHER): Payer: BC Managed Care – PPO

## 2015-07-16 DIAGNOSIS — J309 Allergic rhinitis, unspecified: Secondary | ICD-10-CM

## 2015-07-19 DIAGNOSIS — J3089 Other allergic rhinitis: Secondary | ICD-10-CM | POA: Diagnosis not present

## 2015-07-22 DIAGNOSIS — J301 Allergic rhinitis due to pollen: Secondary | ICD-10-CM | POA: Diagnosis not present

## 2015-07-23 ENCOUNTER — Ambulatory Visit (INDEPENDENT_AMBULATORY_CARE_PROVIDER_SITE_OTHER): Payer: BC Managed Care – PPO | Admitting: *Deleted

## 2015-07-23 DIAGNOSIS — J309 Allergic rhinitis, unspecified: Secondary | ICD-10-CM | POA: Diagnosis not present

## 2015-08-01 ENCOUNTER — Ambulatory Visit (INDEPENDENT_AMBULATORY_CARE_PROVIDER_SITE_OTHER): Payer: BC Managed Care – PPO

## 2015-08-01 DIAGNOSIS — J309 Allergic rhinitis, unspecified: Secondary | ICD-10-CM

## 2015-08-09 ENCOUNTER — Ambulatory Visit (INDEPENDENT_AMBULATORY_CARE_PROVIDER_SITE_OTHER): Payer: BC Managed Care – PPO | Admitting: Neurology

## 2015-08-09 DIAGNOSIS — J309 Allergic rhinitis, unspecified: Secondary | ICD-10-CM

## 2015-08-14 ENCOUNTER — Ambulatory Visit (INDEPENDENT_AMBULATORY_CARE_PROVIDER_SITE_OTHER): Payer: BC Managed Care – PPO

## 2015-08-14 DIAGNOSIS — J309 Allergic rhinitis, unspecified: Secondary | ICD-10-CM | POA: Diagnosis not present

## 2015-08-20 ENCOUNTER — Ambulatory Visit (INDEPENDENT_AMBULATORY_CARE_PROVIDER_SITE_OTHER): Payer: BC Managed Care – PPO

## 2015-08-20 DIAGNOSIS — J309 Allergic rhinitis, unspecified: Secondary | ICD-10-CM | POA: Diagnosis not present

## 2015-08-29 ENCOUNTER — Ambulatory Visit (INDEPENDENT_AMBULATORY_CARE_PROVIDER_SITE_OTHER): Payer: BC Managed Care – PPO

## 2015-08-29 DIAGNOSIS — J309 Allergic rhinitis, unspecified: Secondary | ICD-10-CM | POA: Diagnosis not present

## 2015-09-04 ENCOUNTER — Ambulatory Visit (INDEPENDENT_AMBULATORY_CARE_PROVIDER_SITE_OTHER): Payer: BC Managed Care – PPO

## 2015-09-04 DIAGNOSIS — J309 Allergic rhinitis, unspecified: Secondary | ICD-10-CM | POA: Diagnosis not present

## 2015-09-12 ENCOUNTER — Ambulatory Visit (INDEPENDENT_AMBULATORY_CARE_PROVIDER_SITE_OTHER): Payer: BC Managed Care – PPO

## 2015-09-12 DIAGNOSIS — J309 Allergic rhinitis, unspecified: Secondary | ICD-10-CM

## 2015-09-18 ENCOUNTER — Ambulatory Visit (INDEPENDENT_AMBULATORY_CARE_PROVIDER_SITE_OTHER): Payer: BC Managed Care – PPO

## 2015-09-18 DIAGNOSIS — J309 Allergic rhinitis, unspecified: Secondary | ICD-10-CM

## 2015-09-24 ENCOUNTER — Ambulatory Visit (INDEPENDENT_AMBULATORY_CARE_PROVIDER_SITE_OTHER): Payer: BC Managed Care – PPO | Admitting: *Deleted

## 2015-09-24 DIAGNOSIS — J309 Allergic rhinitis, unspecified: Secondary | ICD-10-CM

## 2015-10-02 ENCOUNTER — Ambulatory Visit (INDEPENDENT_AMBULATORY_CARE_PROVIDER_SITE_OTHER): Payer: BC Managed Care – PPO

## 2015-10-02 DIAGNOSIS — J309 Allergic rhinitis, unspecified: Secondary | ICD-10-CM | POA: Diagnosis not present

## 2015-10-08 ENCOUNTER — Ambulatory Visit (INDEPENDENT_AMBULATORY_CARE_PROVIDER_SITE_OTHER): Payer: BC Managed Care – PPO

## 2015-10-08 DIAGNOSIS — J309 Allergic rhinitis, unspecified: Secondary | ICD-10-CM

## 2015-10-15 ENCOUNTER — Ambulatory Visit (INDEPENDENT_AMBULATORY_CARE_PROVIDER_SITE_OTHER): Payer: BC Managed Care – PPO

## 2015-10-15 DIAGNOSIS — J309 Allergic rhinitis, unspecified: Secondary | ICD-10-CM

## 2015-10-29 ENCOUNTER — Ambulatory Visit (INDEPENDENT_AMBULATORY_CARE_PROVIDER_SITE_OTHER): Payer: BC Managed Care – PPO

## 2015-10-29 DIAGNOSIS — J309 Allergic rhinitis, unspecified: Secondary | ICD-10-CM

## 2015-11-05 ENCOUNTER — Ambulatory Visit (INDEPENDENT_AMBULATORY_CARE_PROVIDER_SITE_OTHER): Payer: BC Managed Care – PPO

## 2015-11-05 DIAGNOSIS — J309 Allergic rhinitis, unspecified: Secondary | ICD-10-CM

## 2015-11-15 ENCOUNTER — Ambulatory Visit (INDEPENDENT_AMBULATORY_CARE_PROVIDER_SITE_OTHER): Payer: BC Managed Care – PPO | Admitting: *Deleted

## 2015-11-15 DIAGNOSIS — J309 Allergic rhinitis, unspecified: Secondary | ICD-10-CM | POA: Diagnosis not present

## 2015-11-22 ENCOUNTER — Ambulatory Visit (INDEPENDENT_AMBULATORY_CARE_PROVIDER_SITE_OTHER): Payer: BC Managed Care – PPO | Admitting: *Deleted

## 2015-11-22 DIAGNOSIS — J309 Allergic rhinitis, unspecified: Secondary | ICD-10-CM

## 2015-11-27 ENCOUNTER — Ambulatory Visit (INDEPENDENT_AMBULATORY_CARE_PROVIDER_SITE_OTHER): Payer: BC Managed Care – PPO | Admitting: *Deleted

## 2015-11-27 DIAGNOSIS — J309 Allergic rhinitis, unspecified: Secondary | ICD-10-CM | POA: Diagnosis not present

## 2015-11-28 DIAGNOSIS — J3089 Other allergic rhinitis: Secondary | ICD-10-CM | POA: Diagnosis not present

## 2015-12-03 ENCOUNTER — Ambulatory Visit (INDEPENDENT_AMBULATORY_CARE_PROVIDER_SITE_OTHER): Payer: BC Managed Care – PPO | Admitting: Allergy and Immunology

## 2015-12-03 ENCOUNTER — Encounter: Payer: Self-pay | Admitting: Allergy and Immunology

## 2015-12-03 VITALS — BP 118/72 | HR 68 | Resp 16

## 2015-12-03 DIAGNOSIS — J309 Allergic rhinitis, unspecified: Secondary | ICD-10-CM

## 2015-12-03 DIAGNOSIS — J4530 Mild persistent asthma, uncomplicated: Secondary | ICD-10-CM | POA: Diagnosis not present

## 2015-12-03 DIAGNOSIS — Z91018 Allergy to other foods: Secondary | ICD-10-CM

## 2015-12-03 DIAGNOSIS — R197 Diarrhea, unspecified: Secondary | ICD-10-CM

## 2015-12-03 DIAGNOSIS — H1010 Acute atopic conjunctivitis, unspecified eye: Secondary | ICD-10-CM

## 2015-12-03 NOTE — Patient Instructions (Signed)
  1. Can arrange for in clinic food challenge with fruits  2. Decrease Qvar 40 to one inhalation one time per day. Increase to three inhalations three times per day during 'flare up'  3. Continue immunotherapy and Epi-Pen  4. Continue zyrtec and xopenex hfa if needed.   5. Discontinue all gum use.  6. Return in 6 months or earlier if problem.

## 2015-12-03 NOTE — Progress Notes (Signed)
Follow-up Note  Referring Provider: Donald Prose, MD Primary Provider: Lynne Logan, MD Date of Office Visit: 12/03/2015  Subjective:   Brittany Thornton (DOB: 02/07/1976) is a 40 y.o. female who returns to the [facility] on 12/03/2015 in re-evaluation of the following:  HPI: Brittany Thornton presents to this clinic in evaluation of her mild persistent asthma and allergic rhinoconjunctivitis treated with immunotherapy. She is doing quite well with this form of therapy and has not had any adverse effect in quite a while. She does use Zyrtec prior to her immunotherapy.  Her upper airway and eye issues have been nonexistent as has her asthma since I've last seen in his clinic 6 months ago. She rarely uses a short acting bronchodilator and can exercise without any difficulty and has not required a systemic steroid. She's been consistently using Qvar 40 2 inhalations one time per day.  She has been having problems with diarrhea after eating lunch. For the past 3 months she will have watery diarrhea that goes on for several hours after eating lunch. This does not appear to occur with dinner. This does not appear to occur with breakfast. She has no other associated systemic or constitutional symptoms. It should be noted that she does use a sugar-free gum before lunch and after lunch while at work.  She remains away from eating all fruit because of the issue that she is developed in the past with this food substances. She can eat tomatoes and spinach and lettuce without any problem. She does have an EpiPen.    Medication List           beclomethasone 40 MCG/ACT inhaler  Commonly known as:  QVAR  Inhale 2 puffs into the lungs daily.     BORIC ACID EX  1 capsule every 7 (seven) weeks.     CALCIUM-MAGNESIUM PO  Take 2 tablets by mouth daily.     cetirizine 10 MG tablet  Commonly known as:  ZYRTEC  Take 10 mg by mouth daily.     EPIPEN 2-PAK 0.3 mg/0.3 mL Soaj injection    Generic drug:  EPINEPHrine  Inject 0.3 mg into the muscle as needed.     XOPENEX HFA 45 MCG/ACT inhaler  Generic drug:  levalbuterol  Inhale 1-2 puffs into the lungs every 6 (six) hours as needed for wheezing.        Past Medical History  Diagnosis Date  . Rectal itching   . Dyspareunia   . History of kidney stones   . Hematuria   . Asthma   . Vaginal burning 2006  . Dizziness 2005  . Abnormal Pap smear     ASCUS  . SOB (shortness of breath) 2005  . Nausea 2005  . Dizziness 2005  . Elevated prolactin level ([place]) 2004  . Galactorrhea 2004  . Anemia     History reviewed. No pertinent past surgical history.  Allergies  Allergen Reactions  . Codeine   . Other Itching and Swelling    Oral pollinosis - fruit  . Oxycodone     Review of systems negative except as noted in HPI / PMHx or noted below:  Review of Systems  Constitutional: Negative.   HENT: Negative.   Eyes: Negative.   Respiratory: Negative.   Cardiovascular: Negative.   Gastrointestinal: Negative.   Genitourinary: Negative.   Musculoskeletal: Negative.   Skin: Negative.   Neurological: Negative.   Endo/Heme/Allergies: Negative.   Psychiatric/Behavioral: Negative.      Objective:  Filed Vitals:   12/03/15 1018  BP: 118/72  Pulse: 68  Resp: 16          Physical Exam  Constitutional: She is well-developed, well-nourished, and in no distress.  HENT:  Head: Normocephalic.  Right Ear: Tympanic membrane, external ear and ear canal normal.  Left Ear: Tympanic membrane, external ear and ear canal normal.  Nose: Nose normal. No mucosal edema or rhinorrhea.  Mouth/Throat: Uvula is midline, oropharynx is clear and moist and mucous membranes are normal. No oropharyngeal exudate.  Eyes: Conjunctivae are normal.  Neck: Trachea normal. No tracheal tenderness present. No tracheal deviation present. No thyromegaly present.  Cardiovascular: Normal rate, regular rhythm, S1 normal, S2 normal and normal  heart sounds.   No murmur heard. Pulmonary/Chest: Breath sounds normal. No stridor. No respiratory distress. She has no wheezes. She has no rales.  Musculoskeletal: She exhibits no edema.  Lymphadenopathy:       Head (right side): No tonsillar adenopathy present.       Head (left side): No tonsillar adenopathy present.    She has no cervical adenopathy.  Neurological: She is alert. Gait normal.  Skin: No rash noted. She is not diaphoretic. No erythema. Nails show no clubbing.  Psychiatric: Mood and affect normal.    Diagnostics:    Spirometry was performed and demonstrated an FEV1 of 1.75 at 93 % of predicted.  The patient had an Asthma Control Test with the following results: ACT Total Score: 24.    Assessment and Plan:   1. Mild persistent asthma, uncomplicated   2. Allergic rhinoconjunctivitis   3. Food allergy   4. Diarrhea, unspecified type     1. Can arrange for in clinic food challenge with fruits  2. Decrease Qvar 40 to one inhalation one time per day. Increase to three inhalations three times per day during 'flare up'  3. Continue immunotherapy and Epi-Pen  4. Continue zyrtec and xopenex hfa if needed.   5. Discontinue all gum use.  6. Return in 6 months or earlier if problem.   Brittany Thornton appears to be doing quite well regarding her atopic disease while she continues to use her immunotherapy. There are 2 issues that need to be addressed above and beyond her asthma and allergic rhinoconjunctivitis. The first is the fact that she's having diarrhea for the past 3 months that appears to correlate with eating lunch at work. I've asked her to discontinue all of her gum use and we'll see if it is the artificial sweeteners giving rise to this issue. If not, then she needs to go see her primary about further evaluation of this issue. 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 She's presently considering this option. I'll see her back in this clinic in 6 months or earlier if there is a problem.  [name], MD [place]

## 2015-12-06 ENCOUNTER — Ambulatory Visit (INDEPENDENT_AMBULATORY_CARE_PROVIDER_SITE_OTHER): Payer: BC Managed Care – PPO | Admitting: *Deleted

## 2015-12-06 DIAGNOSIS — J309 Allergic rhinitis, unspecified: Secondary | ICD-10-CM

## 2015-12-19 ENCOUNTER — Ambulatory Visit (INDEPENDENT_AMBULATORY_CARE_PROVIDER_SITE_OTHER): Payer: BC Managed Care – PPO

## 2015-12-19 DIAGNOSIS — J309 Allergic rhinitis, unspecified: Secondary | ICD-10-CM | POA: Diagnosis not present

## 2015-12-26 ENCOUNTER — Ambulatory Visit (INDEPENDENT_AMBULATORY_CARE_PROVIDER_SITE_OTHER): Payer: BC Managed Care – PPO | Admitting: *Deleted

## 2015-12-26 DIAGNOSIS — J309 Allergic rhinitis, unspecified: Secondary | ICD-10-CM

## 2016-01-03 ENCOUNTER — Other Ambulatory Visit: Payer: Self-pay

## 2016-01-03 MED ORDER — BECLOMETHASONE DIPROPIONATE 40 MCG/ACT IN AERS: 2.0000 | INHALATION_SPRAY | Freq: Every day | RESPIRATORY_TRACT | Status: DC

## 2016-01-07 ENCOUNTER — Encounter: Payer: BC Managed Care – PPO | Admitting: Allergy and Immunology

## 2016-01-16 ENCOUNTER — Ambulatory Visit (INDEPENDENT_AMBULATORY_CARE_PROVIDER_SITE_OTHER): Payer: BC Managed Care – PPO

## 2016-01-16 DIAGNOSIS — J309 Allergic rhinitis, unspecified: Secondary | ICD-10-CM

## 2016-01-21 ENCOUNTER — Encounter: Payer: BC Managed Care – PPO | Admitting: Allergy and Immunology

## 2016-01-21 ENCOUNTER — Ambulatory Visit (INDEPENDENT_AMBULATORY_CARE_PROVIDER_SITE_OTHER): Payer: BC Managed Care – PPO | Admitting: *Deleted

## 2016-01-21 DIAGNOSIS — J309 Allergic rhinitis, unspecified: Secondary | ICD-10-CM | POA: Diagnosis not present

## 2016-01-22 DIAGNOSIS — J301 Allergic rhinitis due to pollen: Secondary | ICD-10-CM | POA: Diagnosis not present

## 2016-01-23 DIAGNOSIS — J3089 Other allergic rhinitis: Secondary | ICD-10-CM | POA: Diagnosis not present

## 2016-01-28 ENCOUNTER — Ambulatory Visit (INDEPENDENT_AMBULATORY_CARE_PROVIDER_SITE_OTHER): Payer: BC Managed Care – PPO | Admitting: *Deleted

## 2016-01-28 DIAGNOSIS — J309 Allergic rhinitis, unspecified: Secondary | ICD-10-CM | POA: Diagnosis not present

## 2016-02-06 ENCOUNTER — Ambulatory Visit (INDEPENDENT_AMBULATORY_CARE_PROVIDER_SITE_OTHER): Payer: BC Managed Care – PPO

## 2016-02-06 DIAGNOSIS — J309 Allergic rhinitis, unspecified: Secondary | ICD-10-CM | POA: Diagnosis not present

## 2016-02-11 ENCOUNTER — Ambulatory Visit (INDEPENDENT_AMBULATORY_CARE_PROVIDER_SITE_OTHER): Payer: BC Managed Care – PPO

## 2016-02-11 DIAGNOSIS — J309 Allergic rhinitis, unspecified: Secondary | ICD-10-CM

## 2016-02-19 ENCOUNTER — Encounter: Payer: Self-pay | Admitting: Allergy and Immunology

## 2016-02-19 ENCOUNTER — Ambulatory Visit (INDEPENDENT_AMBULATORY_CARE_PROVIDER_SITE_OTHER): Payer: BC Managed Care – PPO | Admitting: Allergy and Immunology

## 2016-02-19 ENCOUNTER — Encounter (INDEPENDENT_AMBULATORY_CARE_PROVIDER_SITE_OTHER): Payer: Self-pay

## 2016-02-19 VITALS — BP 108/78 | HR 72 | Resp 18

## 2016-02-19 DIAGNOSIS — Z91018 Allergy to other foods: Secondary | ICD-10-CM | POA: Diagnosis not present

## 2016-02-19 MED ORDER — EPINEPHRINE 0.3 MG/0.3ML IJ SOAJ: INTRAMUSCULAR | 3 refills | Status: DC

## 2016-02-20 NOTE — Progress Notes (Signed)
Brittany Thornton presents to the clinic today to have a food challenge performed. 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 She was given a incremental food challenge utilizing a group fresh green apple. Utilizing a 3 step protocol she was fed increasing amounts of green apple with her last amount of green apple accounting for approximately one quarter of her green apple. She was observed for over 2 hours and no point in time did she ever have any adverse effects secondary to this food exposure including the absence of any mouth itching or throat itching. Thus it does appear as though her immunotherapy has eliminated oral allergy syndrome symptoms with exposure to apple.

## 2016-02-21 ENCOUNTER — Encounter: Payer: Self-pay | Admitting: *Deleted

## 2016-02-27 ENCOUNTER — Ambulatory Visit (INDEPENDENT_AMBULATORY_CARE_PROVIDER_SITE_OTHER): Payer: BC Managed Care – PPO

## 2016-02-27 DIAGNOSIS — J309 Allergic rhinitis, unspecified: Secondary | ICD-10-CM | POA: Diagnosis not present

## 2016-03-05 NOTE — Progress Notes (Signed)
This encounter was created in error - please disregard.

## 2016-03-17 ENCOUNTER — Ambulatory Visit (INDEPENDENT_AMBULATORY_CARE_PROVIDER_SITE_OTHER): Payer: BC Managed Care – PPO

## 2016-03-17 DIAGNOSIS — J309 Allergic rhinitis, unspecified: Secondary | ICD-10-CM

## 2016-04-02 ENCOUNTER — Ambulatory Visit (INDEPENDENT_AMBULATORY_CARE_PROVIDER_SITE_OTHER): Payer: BC Managed Care – PPO

## 2016-04-02 DIAGNOSIS — J309 Allergic rhinitis, unspecified: Secondary | ICD-10-CM | POA: Diagnosis not present

## 2016-04-10 ENCOUNTER — Ambulatory Visit (INDEPENDENT_AMBULATORY_CARE_PROVIDER_SITE_OTHER): Payer: BC Managed Care – PPO

## 2016-04-10 DIAGNOSIS — J309 Allergic rhinitis, unspecified: Secondary | ICD-10-CM

## 2016-04-21 ENCOUNTER — Ambulatory Visit (INDEPENDENT_AMBULATORY_CARE_PROVIDER_SITE_OTHER): Payer: BC Managed Care – PPO | Admitting: *Deleted

## 2016-04-21 DIAGNOSIS — J309 Allergic rhinitis, unspecified: Secondary | ICD-10-CM | POA: Diagnosis not present

## 2016-04-21 DIAGNOSIS — H1010 Acute atopic conjunctivitis, unspecified eye: Secondary | ICD-10-CM | POA: Diagnosis not present

## 2016-04-28 ENCOUNTER — Ambulatory Visit (INDEPENDENT_AMBULATORY_CARE_PROVIDER_SITE_OTHER): Payer: BC Managed Care – PPO | Admitting: *Deleted

## 2016-04-28 DIAGNOSIS — H1010 Acute atopic conjunctivitis, unspecified eye: Secondary | ICD-10-CM

## 2016-04-28 DIAGNOSIS — J309 Allergic rhinitis, unspecified: Secondary | ICD-10-CM

## 2016-05-05 ENCOUNTER — Ambulatory Visit (INDEPENDENT_AMBULATORY_CARE_PROVIDER_SITE_OTHER): Payer: BC Managed Care – PPO

## 2016-05-05 DIAGNOSIS — J309 Allergic rhinitis, unspecified: Secondary | ICD-10-CM

## 2016-05-13 ENCOUNTER — Ambulatory Visit (INDEPENDENT_AMBULATORY_CARE_PROVIDER_SITE_OTHER): Payer: BC Managed Care – PPO

## 2016-05-13 DIAGNOSIS — J309 Allergic rhinitis, unspecified: Secondary | ICD-10-CM | POA: Diagnosis not present

## 2016-05-19 ENCOUNTER — Ambulatory Visit (INDEPENDENT_AMBULATORY_CARE_PROVIDER_SITE_OTHER): Payer: BC Managed Care – PPO

## 2016-05-19 DIAGNOSIS — J309 Allergic rhinitis, unspecified: Secondary | ICD-10-CM

## 2016-05-28 ENCOUNTER — Ambulatory Visit (INDEPENDENT_AMBULATORY_CARE_PROVIDER_SITE_OTHER): Payer: BC Managed Care – PPO | Admitting: *Deleted

## 2016-05-28 DIAGNOSIS — J309 Allergic rhinitis, unspecified: Secondary | ICD-10-CM

## 2016-06-09 ENCOUNTER — Ambulatory Visit (INDEPENDENT_AMBULATORY_CARE_PROVIDER_SITE_OTHER): Payer: BC Managed Care – PPO | Admitting: Allergy and Immunology

## 2016-06-09 ENCOUNTER — Encounter (INDEPENDENT_AMBULATORY_CARE_PROVIDER_SITE_OTHER): Payer: Self-pay

## 2016-06-09 ENCOUNTER — Encounter: Payer: Self-pay | Admitting: Allergy and Immunology

## 2016-06-09 VITALS — BP 98/62 | HR 64 | Resp 16

## 2016-06-09 DIAGNOSIS — H1010 Acute atopic conjunctivitis, unspecified eye: Secondary | ICD-10-CM

## 2016-06-09 DIAGNOSIS — J309 Allergic rhinitis, unspecified: Secondary | ICD-10-CM

## 2016-06-09 DIAGNOSIS — J4530 Mild persistent asthma, uncomplicated: Secondary | ICD-10-CM | POA: Diagnosis not present

## 2016-06-09 DIAGNOSIS — Z91018 Allergy to other foods: Secondary | ICD-10-CM | POA: Diagnosis not present

## 2016-06-09 NOTE — Patient Instructions (Addendum)
  1. Continue Qvar 40 to one inhalation one time per day. Increase to three inhalations three times per day during 'flare up'  2. Continue immunotherapy and Epi-Pen  3. Continue zyrtec and xopenex hfa if needed.   4. Return in 6 months or earlier if problem.

## 2016-06-09 NOTE — Progress Notes (Signed)
Follow-up Note  Referring Provider: Donald Prose, MD Primary Provider: Lynne Logan, MD Date of Office Visit: 06/09/2016  Subjective:   Brittany Thornton (DOB: 1975-12-25) is a 40 y.o. female who returns to the [facility] on 06/09/2016 in re-evaluation of the following:  HPI: Chanea returns to this clinic in reevaluation of her asthma and allergic rhinoconjunctivitis and oral allergy syndrome and food allergy addressed with immunotherapy. I've not seen her in his clinic in approximately 6 months.  During the interval she has done very well without any significant exacerbations of her asthma requiring her to use a short acting bronchodilator or requiring her to activate an action plan or requiring her to use a systemic steroid.  Her nose has been doing very well and she has not required an antibiotic to treat an episode of sinusitis.  She is now eating an apple every day after she underwent an in clinic food challenge with apple earlier this year. She still continues to remain away from other food products and does have an EpiPen.  As well, her midday diarrhea has completely resolved since she stopped her sorbitol containing chewing gum use after lunch.  Her immunotherapy is going quite well. She's not had any adverse effect from utilizing this form of treatment.  She did receive the flu vaccine this year.    Medication List      beclomethasone 40 MCG/ACT inhaler Commonly known as:  QVAR Inhale 2 puffs into the lungs daily.   BORIC ACID EX 1 capsule every 7 (seven) weeks.   CALCIUM-MAGNESIUM PO Take 2 tablets by mouth daily.   cetirizine 10 MG tablet Commonly known as:  ZYRTEC Take 10 mg by mouth daily.   CULTURELLE PO Take by mouth.   EPINEPHrine 0.3 mg/0.3 mL Soaj injection Commonly known as:  EPIPEN 2-PAK Use as directed for life-threatening allergic reaction.   multivitamin tablet Take 1 tablet by mouth daily.   XOPENEX HFA 45 MCG/ACT  inhaler Generic drug:  levalbuterol Inhale 1-2 puffs into the lungs every 6 (six) hours as needed for wheezing.       Past Medical History:  Diagnosis Date  . Abnormal Pap smear    ASCUS  . Anemia   . Asthma   . Dizziness 2005  . Dizziness 2005  . Dyspareunia   . Elevated prolactin level ([place]) 2004  . Galactorrhea 2004  . Hematuria   . History of kidney stones   . Nausea 2005  . Rectal itching   . SOB (shortness of breath) 2005  . Vaginal burning 2006    No past surgical history on file.  Allergies  Allergen Reactions  . Codeine   . Other Itching and Swelling    Oral pollinosis - fruit  . Oxycodone     Review of systems negative except as noted in HPI / PMHx or noted below:  Review of Systems  Constitutional: Negative.   HENT: Negative.   Eyes: Negative.   Respiratory: Negative.   Cardiovascular: Negative.   Gastrointestinal: Negative.   Genitourinary: Negative.   Musculoskeletal: Negative.   Skin: Negative.   Neurological: Negative.   Endo/Heme/Allergies: Negative.   Psychiatric/Behavioral: Negative.      Objective:   Vitals:   06/09/16 1506  BP: 98/62  Pulse: 64  Resp: 16          Physical Exam  Constitutional: She is well-developed, well-nourished, and in no distress.  HENT:  Head: Normocephalic.  Right Ear: Tympanic membrane, external  ear and ear canal normal.  Left Ear: Tympanic membrane, external ear and ear canal normal.  Nose: Nose normal. No mucosal edema or rhinorrhea.  Mouth/Throat: Uvula is midline, oropharynx is clear and moist and mucous membranes are normal. No oropharyngeal exudate.  Eyes: Conjunctivae are normal.  Neck: Trachea normal. No tracheal tenderness present. No tracheal deviation present. No thyromegaly present.  Cardiovascular: Normal rate, regular rhythm, S1 normal, S2 normal and normal heart sounds.   No murmur heard. Pulmonary/Chest: Breath sounds normal. No stridor. No respiratory distress. She has no wheezes.  She has no rales.  Musculoskeletal: She exhibits no edema.  Lymphadenopathy:       Head (right side): No tonsillar adenopathy present.       Head (left side): No tonsillar adenopathy present.    She has no cervical adenopathy.  Neurological: She is alert. Gait normal.  Skin: No rash noted. She is not diaphoretic. No erythema. Nails show no clubbing.  Psychiatric: Mood and affect normal.    Diagnostics:    Spirometry was performed and demonstrated an FEV1 of 1.67 at 64 % of predicted. She had a difficult time performing the spirometric maneuver today.  Assessment and Plan:   1. Mild persistent asthma, uncomplicated   2. Allergic rhinoconjunctivitis   3. Food allergy     1. Continue Qvar 40 to one inhalation one time per day. Increase to three inhalations three times per day during 'flare up'  2. Continue immunotherapy and Epi-Pen  3. Continue zyrtec and xopenex hfa if needed.   4. Return in 6 months or earlier if problem.   [name] is really doing quite well on her current medical therapy which includes immunotherapy and we'll continue to have her use this therapy over the course the next 6 months and she will contact me should she have any significant problems during the interval.  [name], MD [place]

## 2016-06-10 DIAGNOSIS — J3081 Allergic rhinitis due to animal (cat) (dog) hair and dander: Secondary | ICD-10-CM | POA: Diagnosis not present

## 2016-06-16 ENCOUNTER — Ambulatory Visit (INDEPENDENT_AMBULATORY_CARE_PROVIDER_SITE_OTHER): Payer: BC Managed Care – PPO | Admitting: *Deleted

## 2016-06-16 DIAGNOSIS — H1010 Acute atopic conjunctivitis, unspecified eye: Secondary | ICD-10-CM

## 2016-06-16 DIAGNOSIS — J309 Allergic rhinitis, unspecified: Secondary | ICD-10-CM | POA: Diagnosis not present

## 2016-06-25 ENCOUNTER — Ambulatory Visit (INDEPENDENT_AMBULATORY_CARE_PROVIDER_SITE_OTHER): Payer: BC Managed Care – PPO

## 2016-06-25 DIAGNOSIS — J309 Allergic rhinitis, unspecified: Secondary | ICD-10-CM

## 2016-06-30 ENCOUNTER — Ambulatory Visit (INDEPENDENT_AMBULATORY_CARE_PROVIDER_SITE_OTHER): Payer: BC Managed Care – PPO

## 2016-06-30 DIAGNOSIS — J309 Allergic rhinitis, unspecified: Secondary | ICD-10-CM | POA: Diagnosis not present

## 2016-07-16 ENCOUNTER — Ambulatory Visit (INDEPENDENT_AMBULATORY_CARE_PROVIDER_SITE_OTHER): Payer: BC Managed Care – PPO

## 2016-07-16 DIAGNOSIS — J309 Allergic rhinitis, unspecified: Secondary | ICD-10-CM | POA: Diagnosis not present

## 2016-07-21 ENCOUNTER — Ambulatory Visit (INDEPENDENT_AMBULATORY_CARE_PROVIDER_SITE_OTHER): Payer: BC Managed Care – PPO | Admitting: *Deleted

## 2016-07-21 DIAGNOSIS — J309 Allergic rhinitis, unspecified: Secondary | ICD-10-CM | POA: Diagnosis not present

## 2016-07-28 ENCOUNTER — Ambulatory Visit (INDEPENDENT_AMBULATORY_CARE_PROVIDER_SITE_OTHER): Payer: BC Managed Care – PPO | Admitting: *Deleted

## 2016-07-28 DIAGNOSIS — J309 Allergic rhinitis, unspecified: Secondary | ICD-10-CM | POA: Diagnosis not present

## 2016-08-04 ENCOUNTER — Ambulatory Visit (INDEPENDENT_AMBULATORY_CARE_PROVIDER_SITE_OTHER): Payer: BC Managed Care – PPO | Admitting: *Deleted

## 2016-08-04 DIAGNOSIS — J309 Allergic rhinitis, unspecified: Secondary | ICD-10-CM

## 2016-08-13 ENCOUNTER — Ambulatory Visit (INDEPENDENT_AMBULATORY_CARE_PROVIDER_SITE_OTHER): Payer: BC Managed Care – PPO

## 2016-08-13 DIAGNOSIS — J309 Allergic rhinitis, unspecified: Secondary | ICD-10-CM

## 2016-08-18 ENCOUNTER — Ambulatory Visit (INDEPENDENT_AMBULATORY_CARE_PROVIDER_SITE_OTHER): Payer: BC Managed Care – PPO

## 2016-08-18 DIAGNOSIS — J309 Allergic rhinitis, unspecified: Secondary | ICD-10-CM | POA: Diagnosis not present

## 2016-08-24 NOTE — Addendum Note (Signed)
Addended by: Berniece Andreas L on: 08/24/2016 11:00 AM   Modules accepted: Orders

## 2016-08-27 ENCOUNTER — Ambulatory Visit (INDEPENDENT_AMBULATORY_CARE_PROVIDER_SITE_OTHER): Payer: BC Managed Care – PPO

## 2016-08-27 DIAGNOSIS — J309 Allergic rhinitis, unspecified: Secondary | ICD-10-CM | POA: Diagnosis not present

## 2016-09-01 ENCOUNTER — Ambulatory Visit (INDEPENDENT_AMBULATORY_CARE_PROVIDER_SITE_OTHER): Payer: BC Managed Care – PPO | Admitting: *Deleted

## 2016-09-01 DIAGNOSIS — J309 Allergic rhinitis, unspecified: Secondary | ICD-10-CM | POA: Diagnosis not present

## 2016-09-08 ENCOUNTER — Ambulatory Visit (INDEPENDENT_AMBULATORY_CARE_PROVIDER_SITE_OTHER): Payer: BC Managed Care – PPO | Admitting: *Deleted

## 2016-09-08 DIAGNOSIS — J309 Allergic rhinitis, unspecified: Secondary | ICD-10-CM | POA: Diagnosis not present

## 2016-09-15 ENCOUNTER — Ambulatory Visit (INDEPENDENT_AMBULATORY_CARE_PROVIDER_SITE_OTHER): Payer: BC Managed Care – PPO | Admitting: *Deleted

## 2016-09-15 DIAGNOSIS — J309 Allergic rhinitis, unspecified: Secondary | ICD-10-CM | POA: Diagnosis not present

## 2016-09-17 DIAGNOSIS — J301 Allergic rhinitis due to pollen: Secondary | ICD-10-CM | POA: Diagnosis not present

## 2016-09-18 DIAGNOSIS — J3089 Other allergic rhinitis: Secondary | ICD-10-CM | POA: Diagnosis not present

## 2016-09-24 ENCOUNTER — Ambulatory Visit (INDEPENDENT_AMBULATORY_CARE_PROVIDER_SITE_OTHER): Payer: BC Managed Care – PPO | Admitting: *Deleted

## 2016-09-24 DIAGNOSIS — J309 Allergic rhinitis, unspecified: Secondary | ICD-10-CM | POA: Diagnosis not present

## 2016-10-01 ENCOUNTER — Ambulatory Visit (INDEPENDENT_AMBULATORY_CARE_PROVIDER_SITE_OTHER): Payer: BC Managed Care – PPO | Admitting: *Deleted

## 2016-10-01 DIAGNOSIS — J309 Allergic rhinitis, unspecified: Secondary | ICD-10-CM

## 2016-10-06 ENCOUNTER — Ambulatory Visit (INDEPENDENT_AMBULATORY_CARE_PROVIDER_SITE_OTHER): Payer: BC Managed Care – PPO | Admitting: *Deleted

## 2016-10-06 DIAGNOSIS — J309 Allergic rhinitis, unspecified: Secondary | ICD-10-CM

## 2016-10-15 ENCOUNTER — Ambulatory Visit (INDEPENDENT_AMBULATORY_CARE_PROVIDER_SITE_OTHER): Payer: BC Managed Care – PPO | Admitting: *Deleted

## 2016-10-15 DIAGNOSIS — J309 Allergic rhinitis, unspecified: Secondary | ICD-10-CM | POA: Diagnosis not present

## 2016-10-29 ENCOUNTER — Ambulatory Visit (INDEPENDENT_AMBULATORY_CARE_PROVIDER_SITE_OTHER): Payer: BC Managed Care – PPO

## 2016-10-29 DIAGNOSIS — J309 Allergic rhinitis, unspecified: Secondary | ICD-10-CM

## 2016-11-05 ENCOUNTER — Ambulatory Visit (INDEPENDENT_AMBULATORY_CARE_PROVIDER_SITE_OTHER): Payer: BC Managed Care – PPO | Admitting: *Deleted

## 2016-11-05 DIAGNOSIS — J309 Allergic rhinitis, unspecified: Secondary | ICD-10-CM

## 2016-11-12 ENCOUNTER — Ambulatory Visit (INDEPENDENT_AMBULATORY_CARE_PROVIDER_SITE_OTHER): Payer: BC Managed Care – PPO | Admitting: *Deleted

## 2016-11-12 DIAGNOSIS — J309 Allergic rhinitis, unspecified: Secondary | ICD-10-CM | POA: Diagnosis not present

## 2016-11-26 ENCOUNTER — Ambulatory Visit (INDEPENDENT_AMBULATORY_CARE_PROVIDER_SITE_OTHER): Payer: BC Managed Care – PPO | Admitting: *Deleted

## 2016-11-26 DIAGNOSIS — J309 Allergic rhinitis, unspecified: Secondary | ICD-10-CM

## 2016-12-08 ENCOUNTER — Encounter: Payer: Self-pay | Admitting: Allergy and Immunology

## 2016-12-08 ENCOUNTER — Ambulatory Visit (INDEPENDENT_AMBULATORY_CARE_PROVIDER_SITE_OTHER): Payer: BC Managed Care – PPO | Admitting: Allergy and Immunology

## 2016-12-08 VITALS — BP 110/70 | HR 66 | Resp 16

## 2016-12-08 DIAGNOSIS — T781XXD Other adverse food reactions, not elsewhere classified, subsequent encounter: Secondary | ICD-10-CM | POA: Diagnosis not present

## 2016-12-08 DIAGNOSIS — J4530 Mild persistent asthma, uncomplicated: Secondary | ICD-10-CM | POA: Diagnosis not present

## 2016-12-08 DIAGNOSIS — J3089 Other allergic rhinitis: Secondary | ICD-10-CM

## 2016-12-08 DIAGNOSIS — Z91018 Allergy to other foods: Secondary | ICD-10-CM | POA: Diagnosis not present

## 2016-12-08 NOTE — Patient Instructions (Signed)
  1. Continue Qvar 40 REDIHALER one inhalation one time per day. Increase to three inhalations three times per day during 'flare up'  2. Continue immunotherapy and Epi-Pen  3. Continue zyrtec and xopenex hfa if needed.   4. Return in 6 months or earlier if problem.

## 2016-12-08 NOTE — Progress Notes (Signed)
Follow-up Note  Referring Provider: Donald Prose, MD Primary Provider: Donald Prose, MD Date of Office Visit: 12/08/2016  Subjective:   Brittany Thornton (DOB: 1976-01-11) is a 41 y.o. female who returns to the [facility] on 12/08/2016 in re-evaluation of the following:  HPI: [name] returns to this clinic in reevaluation of her asthma and allergic rhinoconjunctivitis and oral allergy syndrome and food allergy. I have not seen her in this clinic since November 2017.  She is undergoing a course of immunotherapy and has done quite well with this form of treatment. She has not had any issues with her asthma and no issues with her nose throughout the entire springtime season. She has not required a systemic steroid or antibiotic to treat any respiratory tract issue. She can exercise and rarely uses a short acting bronchodilator. Her immunotherapy is presently at every 2 weeks.  She has expanded her food consumption to include apples and grapes and celery and carrots and she has no problem at all with eating these food products at this point.  Allergies as of 12/08/2016      Reactions   Codeine    Other Itching, Swelling   Oral pollinosis - fruit   Oxycodone       Medication List      beclomethasone 40 MCG/ACT inhaler Commonly known as:  QVAR Inhale 2 puffs into the lungs daily.   BORIC ACID EX 1 capsule every 7 (seven) weeks.   CALCIUM-MAGNESIUM PO Take 2 tablets by mouth daily.   cetirizine 10 MG tablet Commonly known as:  ZYRTEC Take 10 mg by mouth daily.   CULTURELLE PO Take by mouth.   EPINEPHrine 0.3 mg/0.3 mL Soaj injection Commonly known as:  EPIPEN 2-PAK Use as directed for life-threatening allergic reaction.   multivitamin tablet Take 1 tablet by mouth daily.   XOPENEX HFA 45 MCG/ACT inhaler Generic drug:  levalbuterol Inhale 1-2 puffs into the lungs every 6 (six) hours as needed for wheezing.       Past Medical History:  Diagnosis  Date  . Abnormal Pap smear    ASCUS  . Anemia   . Asthma   . Dizziness 2005  . Dizziness 2005  . Dyspareunia   . Elevated prolactin level ([place]) 2004  . Galactorrhea 2004  . Hematuria   . History of kidney stones   . Nausea 2005  . Rectal itching   . SOB (shortness of breath) 2005  . Vaginal burning 2006    No past surgical history on file.  Review of systems negative except as noted in HPI / PMHx or noted below:  Review of Systems  Constitutional: Negative.   HENT: Negative.   Eyes: Negative.   Respiratory: Negative.   Cardiovascular: Negative.   Gastrointestinal: Negative.   Genitourinary: Negative.   Musculoskeletal: Negative.   Skin: Negative.   Neurological: Negative.   Endo/Heme/Allergies: Negative.   Psychiatric/Behavioral: Negative.      Objective:   Vitals:   12/08/16 1513  BP: 110/70  Pulse: 66  Resp: 16          Physical Exam  Constitutional: She is well-developed, well-nourished, and in no distress.  HENT:  Head: Normocephalic.  Right Ear: Tympanic membrane, external ear and ear canal normal.  Left Ear: Tympanic membrane, external ear and ear canal normal.  Nose: Nose normal. No mucosal edema or rhinorrhea.  Mouth/Throat: Uvula is midline, oropharynx is clear and moist and mucous membranes are normal. No oropharyngeal exudate.  Eyes: Conjunctivae are normal.  Neck: Trachea normal. No tracheal tenderness present. No tracheal deviation present. No thyromegaly present.  Cardiovascular: Normal rate, regular rhythm, S1 normal, S2 normal and normal heart sounds.   No murmur heard. Pulmonary/Chest: Breath sounds normal. No stridor. No respiratory distress. She has no wheezes. She has no rales.  Musculoskeletal: She exhibits no edema.  Lymphadenopathy:       Head (right side): No tonsillar adenopathy present.       Head (left side): No tonsillar adenopathy present.    She has no cervical adenopathy.  Neurological: She is alert. Gait normal.    Skin: No rash noted. She is not diaphoretic. No erythema. Nails show no clubbing.  Psychiatric: Mood and affect normal.    Diagnostics:    Spirometry was performed and demonstrated an FEV1 of 1.84 at 71 % of predicted.  The patient had an Asthma Control Test with the following results: ACT Total Score: 24.    Assessment and Plan:   1. Mild persistent asthma, uncomplicated   2. Other allergic rhinitis   3. Food allergy   4. Pollen-food allergy, subsequent encounter     1. Continue Qvar 40 REDIHALER one inhalation one time per day. Increase to three inhalations three times per day during 'flare up'  2. Continue immunotherapy and Epi-Pen  3. Continue zyrtec and xopenex hfa if needed.   4. Return in 6 months or earlier if problem.   [name] appears to be doing quite well on her current plan which includes immunotherapy directed against pollen which also appears to be affecting her oral allergy syndrome as well as her respiratory tract disease. She will continue to use the therapy mentioned above which does include anti-inflammatory agents for her respiratory tract and an action plan if required. I will see her back in this clinic in 6 months or earlier if there is a problem.  [name], MD Allergy / Immunology [place]

## 2016-12-10 ENCOUNTER — Ambulatory Visit (INDEPENDENT_AMBULATORY_CARE_PROVIDER_SITE_OTHER): Payer: BC Managed Care – PPO | Admitting: *Deleted

## 2016-12-10 DIAGNOSIS — J309 Allergic rhinitis, unspecified: Secondary | ICD-10-CM

## 2016-12-11 DIAGNOSIS — J301 Allergic rhinitis due to pollen: Secondary | ICD-10-CM | POA: Diagnosis not present

## 2017-01-07 ENCOUNTER — Ambulatory Visit (INDEPENDENT_AMBULATORY_CARE_PROVIDER_SITE_OTHER): Payer: BC Managed Care – PPO | Admitting: *Deleted

## 2017-01-07 DIAGNOSIS — J309 Allergic rhinitis, unspecified: Secondary | ICD-10-CM | POA: Diagnosis not present

## 2017-01-14 ENCOUNTER — Ambulatory Visit (INDEPENDENT_AMBULATORY_CARE_PROVIDER_SITE_OTHER): Payer: BC Managed Care – PPO | Admitting: *Deleted

## 2017-01-14 DIAGNOSIS — J309 Allergic rhinitis, unspecified: Secondary | ICD-10-CM | POA: Diagnosis not present

## 2017-01-28 ENCOUNTER — Ambulatory Visit (INDEPENDENT_AMBULATORY_CARE_PROVIDER_SITE_OTHER): Payer: BC Managed Care – PPO | Admitting: *Deleted

## 2017-01-28 DIAGNOSIS — J309 Allergic rhinitis, unspecified: Secondary | ICD-10-CM | POA: Diagnosis not present

## 2017-02-04 ENCOUNTER — Ambulatory Visit (INDEPENDENT_AMBULATORY_CARE_PROVIDER_SITE_OTHER): Payer: BC Managed Care – PPO | Admitting: *Deleted

## 2017-02-04 DIAGNOSIS — J309 Allergic rhinitis, unspecified: Secondary | ICD-10-CM | POA: Diagnosis not present

## 2017-02-25 ENCOUNTER — Ambulatory Visit (INDEPENDENT_AMBULATORY_CARE_PROVIDER_SITE_OTHER): Payer: BC Managed Care – PPO | Admitting: *Deleted

## 2017-02-25 DIAGNOSIS — J309 Allergic rhinitis, unspecified: Secondary | ICD-10-CM

## 2017-03-04 ENCOUNTER — Ambulatory Visit (INDEPENDENT_AMBULATORY_CARE_PROVIDER_SITE_OTHER): Payer: BC Managed Care – PPO | Admitting: *Deleted

## 2017-03-04 DIAGNOSIS — J309 Allergic rhinitis, unspecified: Secondary | ICD-10-CM

## 2017-03-08 ENCOUNTER — Other Ambulatory Visit: Payer: Self-pay

## 2017-03-08 MED ORDER — BECLOMETHASONE DIPROP HFA 40 MCG/ACT IN AERB: 2.0000 | INHALATION_SPRAY | Freq: Every day | RESPIRATORY_TRACT | 3 refills | Status: DC

## 2017-03-11 ENCOUNTER — Ambulatory Visit (INDEPENDENT_AMBULATORY_CARE_PROVIDER_SITE_OTHER): Payer: BC Managed Care – PPO | Admitting: *Deleted

## 2017-03-11 DIAGNOSIS — J309 Allergic rhinitis, unspecified: Secondary | ICD-10-CM | POA: Diagnosis not present

## 2017-03-18 ENCOUNTER — Ambulatory Visit (INDEPENDENT_AMBULATORY_CARE_PROVIDER_SITE_OTHER): Payer: BC Managed Care – PPO | Admitting: *Deleted

## 2017-03-18 DIAGNOSIS — J309 Allergic rhinitis, unspecified: Secondary | ICD-10-CM | POA: Diagnosis not present

## 2017-03-24 ENCOUNTER — Telehealth: Payer: Self-pay | Admitting: Allergy and Immunology

## 2017-03-24 MED ORDER — FLUTICASONE PROPIONATE HFA 44 MCG/ACT IN AERO: 2.0000 | INHALATION_SPRAY | Freq: Every day | RESPIRATORY_TRACT | 5 refills | Status: DC

## 2017-03-24 NOTE — Telephone Encounter (Signed)
I called Brittany Thornton to find out more information in regards to the Qvar redihaler. The patients insurance does not cover that inhaler. I sent script for Flovent 44. Patient informed.

## 2017-03-24 NOTE — Telephone Encounter (Signed)
Patient said she tried getting her Qvar filled and she was told it was cancelled? She would like to know why. Last seen in May 2018. [place].

## 2017-04-01 ENCOUNTER — Ambulatory Visit (INDEPENDENT_AMBULATORY_CARE_PROVIDER_SITE_OTHER): Payer: BC Managed Care – PPO | Admitting: *Deleted

## 2017-04-01 DIAGNOSIS — J309 Allergic rhinitis, unspecified: Secondary | ICD-10-CM | POA: Diagnosis not present

## 2017-04-27 ENCOUNTER — Ambulatory Visit (INDEPENDENT_AMBULATORY_CARE_PROVIDER_SITE_OTHER): Payer: BC Managed Care – PPO | Admitting: *Deleted

## 2017-04-27 DIAGNOSIS — J309 Allergic rhinitis, unspecified: Secondary | ICD-10-CM | POA: Diagnosis not present

## 2017-04-29 ENCOUNTER — Encounter: Payer: Self-pay | Admitting: *Deleted

## 2017-04-29 NOTE — Progress Notes (Signed)
Maintenance vials made

## 2017-04-30 DIAGNOSIS — J301 Allergic rhinitis due to pollen: Secondary | ICD-10-CM | POA: Diagnosis not present

## 2017-05-13 ENCOUNTER — Ambulatory Visit (INDEPENDENT_AMBULATORY_CARE_PROVIDER_SITE_OTHER): Payer: BC Managed Care – PPO

## 2017-05-13 DIAGNOSIS — J309 Allergic rhinitis, unspecified: Secondary | ICD-10-CM

## 2017-06-01 ENCOUNTER — Ambulatory Visit (INDEPENDENT_AMBULATORY_CARE_PROVIDER_SITE_OTHER): Payer: BC Managed Care – PPO | Admitting: *Deleted

## 2017-06-01 DIAGNOSIS — J309 Allergic rhinitis, unspecified: Secondary | ICD-10-CM

## 2017-06-24 ENCOUNTER — Ambulatory Visit (INDEPENDENT_AMBULATORY_CARE_PROVIDER_SITE_OTHER): Payer: BC Managed Care – PPO | Admitting: *Deleted

## 2017-06-24 DIAGNOSIS — J309 Allergic rhinitis, unspecified: Secondary | ICD-10-CM

## 2017-07-15 ENCOUNTER — Ambulatory Visit (INDEPENDENT_AMBULATORY_CARE_PROVIDER_SITE_OTHER): Payer: BC Managed Care – PPO | Admitting: *Deleted

## 2017-07-15 DIAGNOSIS — J309 Allergic rhinitis, unspecified: Secondary | ICD-10-CM | POA: Diagnosis not present

## 2017-07-22 ENCOUNTER — Ambulatory Visit (INDEPENDENT_AMBULATORY_CARE_PROVIDER_SITE_OTHER): Payer: BC Managed Care – PPO | Admitting: *Deleted

## 2017-07-22 DIAGNOSIS — J309 Allergic rhinitis, unspecified: Secondary | ICD-10-CM | POA: Diagnosis not present

## 2017-08-05 ENCOUNTER — Ambulatory Visit (INDEPENDENT_AMBULATORY_CARE_PROVIDER_SITE_OTHER): Payer: BC Managed Care – PPO | Admitting: *Deleted

## 2017-08-05 DIAGNOSIS — J309 Allergic rhinitis, unspecified: Secondary | ICD-10-CM | POA: Diagnosis not present

## 2017-08-12 ENCOUNTER — Ambulatory Visit (INDEPENDENT_AMBULATORY_CARE_PROVIDER_SITE_OTHER): Payer: BC Managed Care – PPO | Admitting: *Deleted

## 2017-08-12 DIAGNOSIS — J309 Allergic rhinitis, unspecified: Secondary | ICD-10-CM

## 2017-08-24 ENCOUNTER — Ambulatory Visit (INDEPENDENT_AMBULATORY_CARE_PROVIDER_SITE_OTHER): Payer: BC Managed Care – PPO | Admitting: *Deleted

## 2017-08-24 DIAGNOSIS — J309 Allergic rhinitis, unspecified: Secondary | ICD-10-CM

## 2017-09-09 ENCOUNTER — Ambulatory Visit (INDEPENDENT_AMBULATORY_CARE_PROVIDER_SITE_OTHER): Payer: BC Managed Care – PPO | Admitting: *Deleted

## 2017-09-09 DIAGNOSIS — J309 Allergic rhinitis, unspecified: Secondary | ICD-10-CM | POA: Diagnosis not present

## 2017-09-23 ENCOUNTER — Ambulatory Visit (INDEPENDENT_AMBULATORY_CARE_PROVIDER_SITE_OTHER): Payer: BC Managed Care – PPO | Admitting: *Deleted

## 2017-09-23 DIAGNOSIS — J309 Allergic rhinitis, unspecified: Secondary | ICD-10-CM | POA: Diagnosis not present

## 2017-10-12 ENCOUNTER — Ambulatory Visit (INDEPENDENT_AMBULATORY_CARE_PROVIDER_SITE_OTHER): Payer: BC Managed Care – PPO | Admitting: *Deleted

## 2017-10-12 DIAGNOSIS — J309 Allergic rhinitis, unspecified: Secondary | ICD-10-CM

## 2017-10-27 ENCOUNTER — Encounter: Payer: Self-pay | Admitting: *Deleted

## 2017-10-27 NOTE — Progress Notes (Signed)
Maintenance vial made. Exp: 10-28-18. hv 

## 2017-11-03 DIAGNOSIS — J301 Allergic rhinitis due to pollen: Secondary | ICD-10-CM | POA: Diagnosis not present

## 2017-11-04 ENCOUNTER — Ambulatory Visit (INDEPENDENT_AMBULATORY_CARE_PROVIDER_SITE_OTHER): Payer: BC Managed Care – PPO | Admitting: *Deleted

## 2017-11-04 DIAGNOSIS — J309 Allergic rhinitis, unspecified: Secondary | ICD-10-CM

## 2017-11-26 ENCOUNTER — Ambulatory Visit (INDEPENDENT_AMBULATORY_CARE_PROVIDER_SITE_OTHER): Payer: BC Managed Care – PPO

## 2017-11-26 DIAGNOSIS — J309 Allergic rhinitis, unspecified: Secondary | ICD-10-CM | POA: Diagnosis not present

## 2017-12-09 ENCOUNTER — Ambulatory Visit (INDEPENDENT_AMBULATORY_CARE_PROVIDER_SITE_OTHER): Payer: BC Managed Care – PPO | Admitting: *Deleted

## 2017-12-09 DIAGNOSIS — J309 Allergic rhinitis, unspecified: Secondary | ICD-10-CM

## 2017-12-21 ENCOUNTER — Ambulatory Visit (INDEPENDENT_AMBULATORY_CARE_PROVIDER_SITE_OTHER): Payer: BC Managed Care – PPO | Admitting: *Deleted

## 2017-12-21 DIAGNOSIS — J309 Allergic rhinitis, unspecified: Secondary | ICD-10-CM

## 2017-12-27 ENCOUNTER — Telehealth: Payer: Self-pay | Admitting: Allergy and Immunology

## 2017-12-27 NOTE — Telephone Encounter (Signed)
Pt called and said that she has had 3 break outs and needs to find out what is causing it. [facility] [address] [phone].

## 2017-12-27 NOTE — Telephone Encounter (Signed)
I spoke with Brittany Thornton and we have scheduled an appointment for her to come in to be checked out.

## 2017-12-28 ENCOUNTER — Ambulatory Visit: Payer: BC Managed Care – PPO | Admitting: Allergy and Immunology

## 2017-12-28 ENCOUNTER — Encounter: Payer: Self-pay | Admitting: Allergy and Immunology

## 2017-12-28 VITALS — BP 102/62 | HR 72 | Resp 16 | Ht 62.0 in | Wt 150.6 lb

## 2017-12-28 DIAGNOSIS — Z91018 Allergy to other foods: Secondary | ICD-10-CM | POA: Diagnosis not present

## 2017-12-28 DIAGNOSIS — J3089 Other allergic rhinitis: Secondary | ICD-10-CM | POA: Diagnosis not present

## 2017-12-28 DIAGNOSIS — J4530 Mild persistent asthma, uncomplicated: Secondary | ICD-10-CM | POA: Diagnosis not present

## 2017-12-28 DIAGNOSIS — T781XXD Other adverse food reactions, not elsewhere classified, subsequent encounter: Secondary | ICD-10-CM | POA: Diagnosis not present

## 2017-12-28 DIAGNOSIS — L308 Other specified dermatitis: Secondary | ICD-10-CM | POA: Diagnosis not present

## 2017-12-28 DIAGNOSIS — L989 Disorder of the skin and subcutaneous tissue, unspecified: Secondary | ICD-10-CM

## 2017-12-28 MED ORDER — EPINEPHRINE 0.3 MG/0.3ML IJ SOAJ: INTRAMUSCULAR | 1 refills | Status: DC

## 2017-12-28 MED ORDER — MOMETASONE FUROATE 0.1 % EX CREA: TOPICAL_CREAM | CUTANEOUS | 2 refills | Status: DC

## 2017-12-28 MED ORDER — BECLOMETHASONE DIPROP HFA 40 MCG/ACT IN AERB: 2.0000 | INHALATION_SPRAY | Freq: Every day | RESPIRATORY_TRACT | 3 refills | Status: DC

## 2017-12-28 MED ORDER — LEVALBUTEROL TARTRATE 45 MCG/ACT IN AERO: 1.0000 | INHALATION_SPRAY | Freq: Four times a day (QID) | RESPIRATORY_TRACT | 1 refills | Status: DC | PRN

## 2017-12-28 NOTE — Patient Instructions (Addendum)
  1. Continue Qvar 40 REDIHALER one inhalation one time per day. Increase to three inhalations three times per day during 'flare up'  2. Continue immunotherapy and AUVI-Q 0.3  3. Continue zyrtec and xopenex hfa if needed.   4. Can use mometasone 0.1% cream to dermatitis 1-2 times per day if needed  5. Further evaluation of dermatitis?  6. Return in 6 months or earlier if problem.   7. Obtain fall flu vaccine

## 2017-12-28 NOTE — Progress Notes (Signed)
Follow-up Note  Referring Provider: Donald Prose, MD Primary Provider: Donald Prose, MD Date of Office Visit: 12/28/2017  Subjective:   Brittany Thornton (DOB: 11/30/1975) is a 42 y.o. female who returns to the [facility] on 12/28/2017 in re-evaluation of the following:  HPI: Janny presents to this clinic in reevaluation of her asthma and allergic rhinoconjunctivitis and oral allergy syndrome and food allergy and a dermatitis.  I have not seen her in this clinic since 08 Dec 2016.  Overall her respiratory tract has done wonderful.  She uses Qvar or Flovent depending on what her insurance company gives her on a regular basis at a relatively low dose.  She continues on immunotherapy currently at every 2 weeks and has not had any adverse effect secondary to this treatment.  She has very little issues requiring her to use a short acting bronchodilator and she has had very little issues with her upper airways.  She has not required a systemic steroid or antibiotic to treat any type of respiratory tract issue.  As a result of her immunotherapy she is now able to eat many different fruits and vegetables without any problem.  She has developed a red itchy dermatitis involving her arms while vacationing in [place] about a week ago.  She went to visit with her primary care doctor who started her on prednisone yesterday and she is already much better at this point in time.  Apparently this occurred last spring and required steroids last spring and resolved with that treatment.  However, she does have a history of developing some itchiness throughout the entire spring and summer until the weather starts to become cooler predominantly involving her arms.  Allergies as of 12/28/2017      Reactions   Codeine    Other Itching, Swelling   Oral pollinosis - fruit   Oxycodone       Medication List      beclomethasone 40 MCG/ACT inhaler Commonly known as:  QVAR REDIHALER Inhale 2  puffs into the lungs daily.   BORIC ACID EX 1 capsule every 7 (seven) weeks.   CALCIUM-MAGNESIUM PO Take 2 tablets by mouth daily.   cetirizine 10 MG tablet Commonly known as:  ZYRTEC Take 10 mg by mouth daily.   CULTURELLE PO Take by mouth.   EPINEPHrine 0.3 mg/0.3 mL Soaj injection Commonly known as:  EPIPEN 2-PAK Use as directed for life-threatening allergic reaction.   fluticasone 44 MCG/ACT inhaler Commonly known as:  FLOVENT HFA Inhale 2 puffs into the lungs daily.   multivitamin tablet Take 1 tablet by mouth daily.   XOPENEX HFA 45 MCG/ACT inhaler Generic drug:  levalbuterol Inhale 1-2 puffs into the lungs every 6 (six) hours as needed for wheezing.       Past Medical History:  Diagnosis Date  . Abnormal Pap smear    ASCUS  . Anemia   . Asthma   . Dizziness 2005  . Dizziness 2005  . Dyspareunia   . Elevated prolactin level ([place]) 2004  . Galactorrhea 2004  . Hematuria   . History of kidney stones   . Nausea 2005  . Rectal itching   . SOB (shortness of breath) 2005  . Vaginal burning 2006    History reviewed. No pertinent surgical history.  Review of systems negative except as noted in HPI / PMHx or noted below:  Review of Systems  Constitutional: Negative.   HENT: Negative.   Eyes: Negative.   Respiratory: Negative.  Cardiovascular: Negative.   Gastrointestinal: Negative.   Genitourinary: Negative.   Musculoskeletal: Negative.   Skin: Negative.   Neurological: Negative.   Endo/Heme/Allergies: Negative.   Psychiatric/Behavioral: Negative.      Objective:   Vitals:   12/28/17 1014  BP: 102/62  Pulse: 72  Resp: 16   Height: 5\' 2"  (157.5 cm)  Weight: 150 lb 9.6 oz (68.3 kg)   Physical Exam  HENT:  Head: Normocephalic.  Right Ear: Tympanic membrane, external ear and ear canal normal.  Left Ear: Tympanic membrane, external ear and ear canal normal.  Nose: Nose normal. No mucosal edema or rhinorrhea.  Mouth/Throat: Uvula is  midline, oropharynx is clear and moist and mucous membranes are normal. No oropharyngeal exudate.  Eyes: Conjunctivae are normal.  Neck: Trachea normal. No tracheal tenderness present. No tracheal deviation present. No thyromegaly present.  Cardiovascular: Normal rate, regular rhythm, S1 normal, S2 normal and normal heart sounds.  No murmur heard. Pulmonary/Chest: Breath sounds normal. No stridor. No respiratory distress. She has no wheezes. She has no rales.  Musculoskeletal: She exhibits no edema.  Lymphadenopathy:       Head (right side): No tonsillar adenopathy present.       Head (left side): No tonsillar adenopathy present.    She has no cervical adenopathy.  Neurological: She is alert.  Skin: Rash (Slightly erythematous slightly scaly dermatitis with patchy distribution involving both arms.) noted. She is not diaphoretic. No erythema. Nails show no clubbing.    Diagnostics:    Spirometry was performed and demonstrated an FEV1 of 2.02 at 79 % of predicted.  The patient had an Asthma Control Test with the following results: ACT Total Score: 24.    Assessment and Plan:   1. Asthma, well controlled, mild persistent   2. Other allergic rhinitis   3. Pollen-food allergy, subsequent encounter   4. Food allergy   5. Inflammatory dermatosis      1. Continue Qvar 40 REDIHALER one inhalation one time per day. Increase to three inhalations three times per day during 'flare up'  2. Continue immunotherapy and AUVI-Q 0.3    3. Continue zyrtec and xopenex hfa if needed.   4. Can use mometasone 0.1% cream to dermatitis 1-2 times per day if needed  5. Further evaluation of dermatitis?  6. Return in 6 months or earlier if problem.   7. Obtain fall flu vaccine  Ela appears to be doing quite well regarding her respiratory tract atopic disease and her oral allergy syndrome and she will continue to use immunotherapy and a low dose of inhaled steroids as noted above.  Her inflammatory  dermatosis appears to be responding quite well to the recent administration of prednisone.  I do not really know what is causing this issue as it appears to arrive only in the springtime.  This would suggest that she may have a component of tinea versicolor although the appearance is not quite consistent with that dermatitis.  For now would just have her use a topical steroid as needed and will see what happens as she moves forward through the remainder of the spring and into the summer.  If she does well I will see her back in this clinic in 6 months or earlier if there is a problem.  [name], MD Allergy / Immunology [place]

## 2017-12-29 ENCOUNTER — Encounter: Payer: Self-pay | Admitting: Allergy and Immunology

## 2018-01-04 ENCOUNTER — Ambulatory Visit (INDEPENDENT_AMBULATORY_CARE_PROVIDER_SITE_OTHER): Payer: BC Managed Care – PPO | Admitting: *Deleted

## 2018-01-04 DIAGNOSIS — J309 Allergic rhinitis, unspecified: Secondary | ICD-10-CM

## 2018-01-11 ENCOUNTER — Ambulatory Visit (INDEPENDENT_AMBULATORY_CARE_PROVIDER_SITE_OTHER): Payer: BC Managed Care – PPO | Admitting: *Deleted

## 2018-01-11 DIAGNOSIS — J309 Allergic rhinitis, unspecified: Secondary | ICD-10-CM | POA: Diagnosis not present

## 2018-01-18 ENCOUNTER — Ambulatory Visit (INDEPENDENT_AMBULATORY_CARE_PROVIDER_SITE_OTHER): Payer: BC Managed Care – PPO | Admitting: *Deleted

## 2018-01-18 DIAGNOSIS — J309 Allergic rhinitis, unspecified: Secondary | ICD-10-CM

## 2018-02-15 ENCOUNTER — Ambulatory Visit (INDEPENDENT_AMBULATORY_CARE_PROVIDER_SITE_OTHER): Payer: BC Managed Care – PPO | Admitting: *Deleted

## 2018-02-15 DIAGNOSIS — J309 Allergic rhinitis, unspecified: Secondary | ICD-10-CM

## 2018-03-08 ENCOUNTER — Ambulatory Visit (INDEPENDENT_AMBULATORY_CARE_PROVIDER_SITE_OTHER): Payer: BC Managed Care – PPO | Admitting: *Deleted

## 2018-03-08 DIAGNOSIS — J309 Allergic rhinitis, unspecified: Secondary | ICD-10-CM

## 2018-03-29 ENCOUNTER — Ambulatory Visit (INDEPENDENT_AMBULATORY_CARE_PROVIDER_SITE_OTHER): Payer: BC Managed Care – PPO | Admitting: *Deleted

## 2018-03-29 DIAGNOSIS — J309 Allergic rhinitis, unspecified: Secondary | ICD-10-CM

## 2018-04-20 NOTE — Progress Notes (Signed)
Vials exp 04-21-19

## 2018-04-21 DIAGNOSIS — J301 Allergic rhinitis due to pollen: Secondary | ICD-10-CM

## 2018-04-28 ENCOUNTER — Ambulatory Visit (INDEPENDENT_AMBULATORY_CARE_PROVIDER_SITE_OTHER): Payer: BC Managed Care – PPO | Admitting: *Deleted

## 2018-04-28 DIAGNOSIS — J309 Allergic rhinitis, unspecified: Secondary | ICD-10-CM

## 2018-05-17 ENCOUNTER — Ambulatory Visit (INDEPENDENT_AMBULATORY_CARE_PROVIDER_SITE_OTHER): Payer: BC Managed Care – PPO | Admitting: *Deleted

## 2018-05-17 DIAGNOSIS — J309 Allergic rhinitis, unspecified: Secondary | ICD-10-CM

## 2018-06-14 ENCOUNTER — Ambulatory Visit (INDEPENDENT_AMBULATORY_CARE_PROVIDER_SITE_OTHER): Payer: BC Managed Care – PPO | Admitting: *Deleted

## 2018-06-14 DIAGNOSIS — J309 Allergic rhinitis, unspecified: Secondary | ICD-10-CM | POA: Diagnosis not present

## 2018-06-28 ENCOUNTER — Ambulatory Visit: Payer: BC Managed Care – PPO | Admitting: Allergy and Immunology

## 2018-06-28 ENCOUNTER — Encounter: Payer: Self-pay | Admitting: Allergy and Immunology

## 2018-06-28 VITALS — BP 110/70 | HR 76 | Resp 16

## 2018-06-28 DIAGNOSIS — L308 Other specified dermatitis: Secondary | ICD-10-CM

## 2018-06-28 DIAGNOSIS — T781XXD Other adverse food reactions, not elsewhere classified, subsequent encounter: Secondary | ICD-10-CM

## 2018-06-28 DIAGNOSIS — L989 Disorder of the skin and subcutaneous tissue, unspecified: Secondary | ICD-10-CM

## 2018-06-28 DIAGNOSIS — J3089 Other allergic rhinitis: Secondary | ICD-10-CM | POA: Diagnosis not present

## 2018-06-28 DIAGNOSIS — J4530 Mild persistent asthma, uncomplicated: Secondary | ICD-10-CM

## 2018-06-28 NOTE — Patient Instructions (Signed)
  1. Continue Qvar 40 REDIHALER one inhalation one time per day. Increase to three inhalations three times per day during 'flare up'  2. Continue immunotherapy and AUVI-Q 0.3  3. Continue zyrtec and xopenex hfa if needed.   4. Can use mometasone 0.1% cream to dermatitis 1-2 times per day if needed  5. Return in 6 months or earlier if problem.

## 2018-06-28 NOTE — Progress Notes (Signed)
Follow-up Note  Referring Provider: Donald Prose, MD Primary Provider: Donald Prose, MD Date of Office Visit: 06/28/2018  Subjective:   Brittany Thornton (DOB: 01-09-1976) is a 42 y.o. female who returns to the [facility] on 06/28/2018 in re-evaluation of the following:  HPI: Arwyn presents to this clinic in reevaluation of asthma and allergic rhinoconjunctivitis and a history of oral allergy syndrome and food allergy and atopic dermatitis involving her arms.  Her last visit to this clinic was 28 December 2017.  She has really done wonderful with her airway and has not had any significant issue requiring her to use a systemic steroid or antibiotic to address an airway issue.  Rarely does she use a short acting bronchodilator and she can exert herself without any problem.  She has been using Qvar about twice a week.  However, for the past week or so she has developed a little bit of nasal congestion and sniffling and some slight cough.  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  She has had very little issues with the dermatitis that involved her arms during her last visit.  She did use topical mometasone which were quite well but she has no need to use this topical agent at this point in time.  Immunotherapy is going quite well currently at every 3 weeks.  Immunotherapy is allowed her to eat fruits and vegetables without any problem.  She did receive the flu vaccine this year.  Allergies as of 06/28/2018      Reactions   Codeine    Other Itching, Swelling   Oral pollinosis - fruit   Oxycodone       Medication List      beclomethasone 40 MCG/ACT inhaler Commonly known as:  QVAR REDIHALER Inhale 2 puffs into the lungs daily.   BORIC ACID EX 1 capsule every 7 (seven) weeks.   CALCIUM-MAGNESIUM PO Take 2 tablets by mouth daily.   cetirizine 10 MG tablet Commonly known as:  ZYRTEC Take 10 mg by  mouth daily.   CULTURELLE PO Take by mouth.   EPINEPHrine 0.3 mg/0.3 mL Soaj injection Commonly known as:  AUVI-Q Use as directed for severe allergic reaction   fluticasone 44 MCG/ACT inhaler Commonly known as:  FLOVENT HFA Inhale 2 puffs into the lungs daily.   levalbuterol 45 MCG/ACT inhaler Commonly known as:  XOPENEX HFA Inhale 1-2 puffs into the lungs every 6 (six) hours as needed for wheezing.   mometasone 0.1 % cream Commonly known as:  ELOCON Use 1-2 times daily as needed   multivitamin tablet Take 1 tablet by mouth daily.       Past Medical History:  Diagnosis Date  . Abnormal Pap smear    ASCUS  . Anemia   . Asthma   . Dizziness 2005  . Dizziness 2005  . Dyspareunia   . Elevated prolactin level ([place]) 2004  . Galactorrhea 2004  . Hematuria   . History of kidney stones   . Nausea 2005  . Rectal itching   . SOB (shortness of breath) 2005  . Vaginal burning 2006    History reviewed. No pertinent surgical history.  Review of systems negative except as noted in HPI / PMHx or noted below:  Review of Systems  Constitutional: Negative.   HENT: Negative.   Eyes: Negative.   Respiratory: Negative.   Cardiovascular: Negative.   Gastrointestinal: Negative.   Genitourinary: Negative.  Musculoskeletal: Negative.   Skin: Negative.   Neurological: Negative.   Endo/Heme/Allergies: Negative.   Psychiatric/Behavioral: Negative.      Objective:   Vitals:   06/28/18 1512  BP: 110/70  Pulse: 76  Resp: 16          Physical Exam Constitutional:      Appearance: She is not diaphoretic.  HENT:     Head: Normocephalic.     Right Ear: Tympanic membrane, ear canal and external ear normal.     Left Ear: Tympanic membrane, ear canal and external ear normal.     Nose: Nose normal. No mucosal edema or rhinorrhea.     Mouth/Throat:     Pharynx: Uvula midline. No oropharyngeal exudate.  Eyes:     Conjunctiva/sclera: Conjunctivae normal.  Neck:      Thyroid: No thyromegaly.     Trachea: Trachea normal. No tracheal tenderness or tracheal deviation.  Cardiovascular:     Rate and Rhythm: Normal rate and regular rhythm.     Heart sounds: Normal heart sounds, S1 normal and S2 normal. No murmur.  Pulmonary:     Effort: No respiratory distress.     Breath sounds: Normal breath sounds. No stridor. No wheezing or rales.  Lymphadenopathy:     Head:     Right side of head: No tonsillar adenopathy.     Left side of head: No tonsillar adenopathy.     Cervical: No cervical adenopathy.  Skin:    Findings: No erythema or rash.     Nails: There is no clubbing.   Neurological:     Mental Status: She is alert.     Diagnostics:    Spirometry was performed and demonstrated an FEV1 of 1.68 at 61 % of predicted.  She had some difficulty performing the spirometric maneuver.   Assessment and Plan:   1. Asthma, well controlled, mild persistent   2. Other allergic rhinitis   3. Pollen-food allergy, subsequent encounter   4. Inflammatory dermatosis      1. Continue Qvar 40 REDIHALER one inhalation one time per day. Increase to three inhalations three times per day during 'flare up'  2. Continue immunotherapy and AUVI-Q 0.3  3. Continue zyrtec and xopenex hfa if needed.   4. Can use mometasone 0.1% cream to dermatitis 1-2 times per day if needed  5. Return in 6 months or earlier if problem.    Senia appears to be doing quite well on her current therapy which basically includes immunotherapy and a very low dose of inhaled steroids.  She may have a viral respiratory tract infection and she can always increase her dose of Qvar should it involve her lungs.  She has several medication she can use as needed as noted above.  I will see her back in this clinic in 6 months or earlier if there is a problem.  [name], MD Allergy / Immunology [place]

## 2018-06-29 ENCOUNTER — Encounter: Payer: Self-pay | Admitting: Allergy and Immunology

## 2018-07-18 ENCOUNTER — Telehealth: Payer: Self-pay

## 2018-07-18 NOTE — Telephone Encounter (Signed)
Patient call and stated the she is on antibiotics and would not be in this week. Patient stated that she will be in next week.

## 2018-07-28 ENCOUNTER — Ambulatory Visit (INDEPENDENT_AMBULATORY_CARE_PROVIDER_SITE_OTHER): Payer: BC Managed Care – PPO | Admitting: *Deleted

## 2018-07-28 DIAGNOSIS — J309 Allergic rhinitis, unspecified: Secondary | ICD-10-CM

## 2018-08-04 ENCOUNTER — Ambulatory Visit (INDEPENDENT_AMBULATORY_CARE_PROVIDER_SITE_OTHER): Payer: BC Managed Care – PPO | Admitting: *Deleted

## 2018-08-04 DIAGNOSIS — J309 Allergic rhinitis, unspecified: Secondary | ICD-10-CM | POA: Diagnosis not present

## 2018-08-11 ENCOUNTER — Ambulatory Visit (INDEPENDENT_AMBULATORY_CARE_PROVIDER_SITE_OTHER): Payer: BC Managed Care – PPO | Admitting: *Deleted

## 2018-08-11 DIAGNOSIS — J309 Allergic rhinitis, unspecified: Secondary | ICD-10-CM | POA: Diagnosis not present

## 2018-08-23 ENCOUNTER — Ambulatory Visit (INDEPENDENT_AMBULATORY_CARE_PROVIDER_SITE_OTHER): Payer: BC Managed Care – PPO | Admitting: *Deleted

## 2018-08-23 DIAGNOSIS — J309 Allergic rhinitis, unspecified: Secondary | ICD-10-CM

## 2018-09-15 ENCOUNTER — Ambulatory Visit (INDEPENDENT_AMBULATORY_CARE_PROVIDER_SITE_OTHER): Payer: BC Managed Care – PPO | Admitting: *Deleted

## 2018-09-15 DIAGNOSIS — J309 Allergic rhinitis, unspecified: Secondary | ICD-10-CM

## 2018-09-20 ENCOUNTER — Ambulatory Visit (INDEPENDENT_AMBULATORY_CARE_PROVIDER_SITE_OTHER): Payer: BC Managed Care – PPO | Admitting: *Deleted

## 2018-09-20 DIAGNOSIS — J309 Allergic rhinitis, unspecified: Secondary | ICD-10-CM | POA: Diagnosis not present

## 2018-11-10 ENCOUNTER — Ambulatory Visit (INDEPENDENT_AMBULATORY_CARE_PROVIDER_SITE_OTHER): Payer: BC Managed Care – PPO

## 2018-11-10 DIAGNOSIS — J309 Allergic rhinitis, unspecified: Secondary | ICD-10-CM

## 2018-11-17 ENCOUNTER — Ambulatory Visit (INDEPENDENT_AMBULATORY_CARE_PROVIDER_SITE_OTHER): Payer: BC Managed Care – PPO

## 2018-11-17 DIAGNOSIS — J309 Allergic rhinitis, unspecified: Secondary | ICD-10-CM

## 2018-11-23 NOTE — Progress Notes (Signed)
VIALS EXP 11-23-2019 

## 2018-11-24 DIAGNOSIS — J301 Allergic rhinitis due to pollen: Secondary | ICD-10-CM

## 2018-12-08 ENCOUNTER — Telehealth: Payer: Self-pay

## 2018-12-08 ENCOUNTER — Ambulatory Visit (INDEPENDENT_AMBULATORY_CARE_PROVIDER_SITE_OTHER): Payer: BC Managed Care – PPO

## 2018-12-08 DIAGNOSIS — J309 Allergic rhinitis, unspecified: Secondary | ICD-10-CM

## 2018-12-08 NOTE — Telephone Encounter (Signed)
Patient is coming in for Rash every summer when traveling and stated in the past she believed it was either going to the beach or being exposed to horses,  But  patient has not travel this year and is still getting rash around the arms. Patient is using ointment which was  prescribed to her for the rash and is taking a daily antihistamine bid during flare ups. She is believing it may be related to the sun exposure since today she was working outside in the yard and developed a rash from the sun exposure. Patient was given allergy injection prior to informing me of the rash and just wanted to let Dr. Neldon Mc know if there is anything he could suggest or would she need to be further evaluated?

## 2018-12-12 NOTE — Telephone Encounter (Signed)
Spoke to patient states she will inform us if rash gets worse. Advised patient she does have an appt coming up on June 23 and rash can be discussed at that time patient verbalized understanding. Did state her pcp sent in prednisone today for rash as she has did this last year. Will discuss with Dr Neldon Mc at visit in 3 weeks but will call us back before then if things get worse.

## 2018-12-12 NOTE — Telephone Encounter (Signed)
Please inform patient that there are some sun induced dermatitis that are sometimes tied up with autoimmune disease.  However, if she responds to some occasional topical mometasone cream as she has in the past then there is really no need for any further evaluation or additional treatment.  She should see how things go over the course of the spring and summer and if her dermatitis gets out of hand that she should let us know and we can make a plan for further evaluation and treatment.

## 2019-01-03 ENCOUNTER — Other Ambulatory Visit: Payer: Self-pay

## 2019-01-03 ENCOUNTER — Ambulatory Visit: Payer: Self-pay | Admitting: *Deleted

## 2019-01-03 ENCOUNTER — Ambulatory Visit: Payer: BC Managed Care – PPO | Admitting: Allergy and Immunology

## 2019-01-03 ENCOUNTER — Encounter: Payer: Self-pay | Admitting: Allergy and Immunology

## 2019-01-03 VITALS — BP 100/80 | HR 71 | Temp 98.7°F | Resp 16 | Ht 62.0 in

## 2019-01-03 DIAGNOSIS — J4530 Mild persistent asthma, uncomplicated: Secondary | ICD-10-CM | POA: Diagnosis not present

## 2019-01-03 DIAGNOSIS — L989 Disorder of the skin and subcutaneous tissue, unspecified: Secondary | ICD-10-CM

## 2019-01-03 DIAGNOSIS — J3089 Other allergic rhinitis: Secondary | ICD-10-CM

## 2019-01-03 DIAGNOSIS — T781XXD Other adverse food reactions, not elsewhere classified, subsequent encounter: Secondary | ICD-10-CM | POA: Diagnosis not present

## 2019-01-03 DIAGNOSIS — L308 Other specified dermatitis: Secondary | ICD-10-CM

## 2019-01-03 DIAGNOSIS — J309 Allergic rhinitis, unspecified: Secondary | ICD-10-CM

## 2019-01-03 MED ORDER — MOMETASONE FUROATE 0.1 % EX CREA: TOPICAL_CREAM | CUTANEOUS | 2 refills | Status: DC

## 2019-01-03 NOTE — Patient Instructions (Addendum)
  1. Continue Qvar 40 REDIHALER 2 inhalation 3-7 times a week depending on disease activity. Increase to three inhalations three times per day during 'flare up'  2. Continue immunotherapy and AUVI-Q 0.3  3. Continue zyrtec and xopenex hfa if needed.   4. Can use mometasone 0.1% cream to dermatitis 1-2 times per day if needed  5. Return in 6 months or earlier if problem.   6. Obtain fall flu vaccine

## 2019-01-03 NOTE — Progress Notes (Signed)
[facility] [place]   Follow-up Note  Referring Provider: Donald Prose, MD Primary Provider: Donald Prose, MD Date of Office Visit: 01/03/2019  Subjective:   Brittany Thornton (DOB: 12/27/1975) is a 43 y.o. female who returns to the [facility] on 01/03/2019 in re-evaluation of the following:  HPI: Brittany Thornton presents to this clinic in evaluation of asthma and allergic rhinoconjunctivitis and oral allergy syndrome and atopic dermatitis.  Her last visit to this clinic was 28 June 2018.  Overall she did very well with her airway since her last visit.  She informs me that she had an antibiotic in February 2020 for an episode of sinusitis but otherwise has not required any systemic steroids or additional antibiotics to treat an airway issue.  She rarely uses a short acting bronchodilator and she can exercise without any problem while using Qvar on a regular basis.  She did have a flareup of the inflammatory dermatosis involving her forearms for which she apparently went to an urgent care June 2020 and received prednisone and restarted her topical steroid.  Fortunately, that issue had completely resolved.  She continues on immunotherapy every 3 weeks without any difficulty at this point in time.  She has expanded her eating to include citrus and apples and grapes and blueberries and all vegetables with no problem.  She still develops mouth issues when using banana and watermelon.  Allergies as of 01/03/2019      Reactions   Codeine    Other Itching, Swelling   Oral pollinosis - fruit   Oxycodone       Medication List    beclomethasone 40 MCG/ACT inhaler Commonly known as: Qvar RediHaler Inhale 2 puffs into the lungs daily.   BORIC ACID EX 1 capsule every 7 (seven) weeks.   CALCIUM-MAGNESIUM PO Take 2 tablets by mouth daily.   cetirizine 10 MG tablet Commonly known as: ZYRTEC Take 10 mg by mouth daily.   CULTURELLE PO  Take by mouth.   EPINEPHrine 0.3 mg/0.3 mL Soaj injection Commonly known as: Auvi-Q Use as directed for severe allergic reaction   levalbuterol 45 MCG/ACT inhaler Commonly known as: Xopenex HFA Inhale 1-2 puffs into the lungs every 6 (six) hours as needed for wheezing.   mometasone 0.1 % cream Commonly known as: ELOCON Use 1-2 times daily as needed   multivitamin tablet Take 1 tablet by mouth daily.       Past Medical History:  Diagnosis Date  . Abnormal Pap smear    ASCUS  . Anemia   . Asthma   . Dizziness 2005  . Dizziness 2005  . Dyspareunia   . Elevated prolactin level ([place]) 2004  . Galactorrhea 2004  . Hematuria   . History of kidney stones   . Nausea 2005  . Rectal itching   . SOB (shortness of breath) 2005  . Vaginal burning 2006    No past surgical history on file.  Review of systems negative except as noted in HPI / PMHx or noted below:  Review of Systems  Constitutional: Negative.   HENT: Negative.   Eyes: Negative.   Respiratory: Negative.   Cardiovascular: Negative.   Gastrointestinal: Negative.   Genitourinary: Negative.   Musculoskeletal: Negative.   Skin: Negative.   Neurological: Negative.   Endo/Heme/Allergies: Negative.   Psychiatric/Behavioral: Negative.      Objective:   Vitals:   01/03/19 1542  BP: 100/80  Pulse: 71  Resp: 16  Temp:  98.7 F (37.1 C)  SpO2: 94%   Height: 5\' 2"  (157.5 cm)      Physical Exam Constitutional:      Appearance: She is not diaphoretic.  HENT:     Head: Normocephalic.     Right Ear: Tympanic membrane, ear canal and external ear normal.     Left Ear: Tympanic membrane, ear canal and external ear normal.     Nose: Nose normal. No mucosal edema or rhinorrhea.     Mouth/Throat:     Pharynx: Uvula midline. No oropharyngeal exudate.  Eyes:     Conjunctiva/sclera: Conjunctivae normal.  Neck:     Thyroid: No thyromegaly.     Trachea: Trachea normal. No tracheal tenderness or tracheal  deviation.  Cardiovascular:     Rate and Rhythm: Normal rate and regular rhythm.     Heart sounds: Normal heart sounds, S1 normal and S2 normal. No murmur.  Pulmonary:     Effort: No respiratory distress.     Breath sounds: Normal breath sounds. No stridor. No wheezing or rales.  Lymphadenopathy:     Head:     Right side of head: No tonsillar adenopathy.     Left side of head: No tonsillar adenopathy.     Cervical: No cervical adenopathy.  Skin:    Findings: No erythema or rash.     Nails: There is no clubbing.   Neurological:     Mental Status: She is alert.     Diagnostics:    Spirometry was performed and demonstrated an FEV1 of 1.91 at 75 % of predicted.  Assessment and Plan:   1. Asthma, well controlled, mild persistent   2. Other allergic rhinitis   3. Pollen-food allergy, subsequent encounter   4. Inflammatory dermatosis      1. Continue Qvar 40 REDIHALER 2 inhalation 3-7 times a week depending on disease activity. Increase to three inhalations three times per day during 'flare up'  2. Continue immunotherapy and AUVI-Q 0.3  3. Continue zyrtec and xopenex hfa if needed.   4. Can use mometasone 0.1% cream to dermatitis 1-2 times per day if needed  5. Return in 6 months or earlier if problem.   6. Obtain fall flu vaccine  Brittany Thornton has done very well over the course of the past 6 months in general while utilizing a plan of therapy which includes immunotherapy and some anti-inflammatory agents for both her airway and skin.  She will continue to utilize this plan and we will attempt to get her to use the lowest amount of Qvar required to control her disease state.  Assuming she does well I will see her back in this clinic in 6 months or earlier if there is a problem.  [name], MD Allergy / Immunology [place]

## 2019-01-04 ENCOUNTER — Encounter: Payer: Self-pay | Admitting: Allergy and Immunology

## 2019-01-12 ENCOUNTER — Ambulatory Visit (INDEPENDENT_AMBULATORY_CARE_PROVIDER_SITE_OTHER): Payer: BC Managed Care – PPO | Admitting: *Deleted

## 2019-01-12 DIAGNOSIS — J309 Allergic rhinitis, unspecified: Secondary | ICD-10-CM

## 2019-01-19 ENCOUNTER — Ambulatory Visit (INDEPENDENT_AMBULATORY_CARE_PROVIDER_SITE_OTHER): Payer: BC Managed Care – PPO | Admitting: *Deleted

## 2019-01-19 DIAGNOSIS — J309 Allergic rhinitis, unspecified: Secondary | ICD-10-CM | POA: Diagnosis not present

## 2019-01-26 ENCOUNTER — Ambulatory Visit (INDEPENDENT_AMBULATORY_CARE_PROVIDER_SITE_OTHER): Payer: BC Managed Care – PPO | Admitting: *Deleted

## 2019-01-26 DIAGNOSIS — J309 Allergic rhinitis, unspecified: Secondary | ICD-10-CM | POA: Diagnosis not present

## 2019-02-02 ENCOUNTER — Ambulatory Visit (INDEPENDENT_AMBULATORY_CARE_PROVIDER_SITE_OTHER): Payer: BC Managed Care – PPO | Admitting: *Deleted

## 2019-02-02 DIAGNOSIS — J309 Allergic rhinitis, unspecified: Secondary | ICD-10-CM

## 2019-02-14 ENCOUNTER — Ambulatory Visit (INDEPENDENT_AMBULATORY_CARE_PROVIDER_SITE_OTHER): Payer: BC Managed Care – PPO | Admitting: *Deleted

## 2019-02-14 DIAGNOSIS — J309 Allergic rhinitis, unspecified: Secondary | ICD-10-CM | POA: Diagnosis not present

## 2019-03-16 ENCOUNTER — Ambulatory Visit (INDEPENDENT_AMBULATORY_CARE_PROVIDER_SITE_OTHER): Payer: BC Managed Care – PPO | Admitting: *Deleted

## 2019-03-16 DIAGNOSIS — J309 Allergic rhinitis, unspecified: Secondary | ICD-10-CM

## 2019-04-27 ENCOUNTER — Ambulatory Visit (INDEPENDENT_AMBULATORY_CARE_PROVIDER_SITE_OTHER): Payer: BC Managed Care – PPO

## 2019-04-27 DIAGNOSIS — J309 Allergic rhinitis, unspecified: Secondary | ICD-10-CM | POA: Diagnosis not present

## 2019-05-04 ENCOUNTER — Ambulatory Visit (INDEPENDENT_AMBULATORY_CARE_PROVIDER_SITE_OTHER): Payer: BC Managed Care – PPO | Admitting: *Deleted

## 2019-05-04 DIAGNOSIS — J309 Allergic rhinitis, unspecified: Secondary | ICD-10-CM

## 2019-05-30 ENCOUNTER — Ambulatory Visit (INDEPENDENT_AMBULATORY_CARE_PROVIDER_SITE_OTHER): Payer: BC Managed Care – PPO

## 2019-05-30 DIAGNOSIS — J309 Allergic rhinitis, unspecified: Secondary | ICD-10-CM

## 2019-07-04 ENCOUNTER — Ambulatory Visit: Payer: BC Managed Care – PPO | Admitting: Allergy and Immunology

## 2019-07-04 ENCOUNTER — Other Ambulatory Visit: Payer: Self-pay

## 2019-07-04 ENCOUNTER — Encounter: Payer: Self-pay | Admitting: Allergy and Immunology

## 2019-07-04 ENCOUNTER — Ambulatory Visit: Payer: Self-pay

## 2019-07-04 VITALS — BP 116/70 | Temp 97.3°F | Resp 18

## 2019-07-04 DIAGNOSIS — J3089 Other allergic rhinitis: Secondary | ICD-10-CM

## 2019-07-04 DIAGNOSIS — L308 Other specified dermatitis: Secondary | ICD-10-CM

## 2019-07-04 DIAGNOSIS — J4530 Mild persistent asthma, uncomplicated: Secondary | ICD-10-CM | POA: Diagnosis not present

## 2019-07-04 DIAGNOSIS — T781XXD Other adverse food reactions, not elsewhere classified, subsequent encounter: Secondary | ICD-10-CM | POA: Diagnosis not present

## 2019-07-04 DIAGNOSIS — L989 Disorder of the skin and subcutaneous tissue, unspecified: Secondary | ICD-10-CM

## 2019-07-04 MED ORDER — LEVALBUTEROL TARTRATE 45 MCG/ACT IN AERO: 1.0000 | INHALATION_SPRAY | Freq: Four times a day (QID) | RESPIRATORY_TRACT | 1 refills | Status: DC | PRN

## 2019-07-04 NOTE — Progress Notes (Signed)
[facility] [place] [facility] [place]   Follow-up Note  Referring Provider: Donald Prose, MD Primary Provider: Donald Prose, MD Date of Office Visit: 07/04/2019  Subjective:   Brittany Thornton (DOB: [birth year]) is a 43 y.o. female who returns to the [facility] on 07/04/2019 in re-evaluation of the following:  HPI: Brittany Thornton presents to this clinic in evaluation of asthma and allergic rhinoconjunctivitis and oral allergy syndrome and atopic dermatitis.  Her last visit to this clinic was 03 January 2019.  She continues on immunotherapy every 4 weeks without any adverse effect.  She has really had very good control of her airway issue and has not required a systemic steroid to treat an asthma flare and rarely uses a short acting bronchodilator and can exercise without any difficulty.  Apparently she did have an episode of sinusitis requiring an antibiotic this fall but otherwise has done very well with her upper airway.  Her forearm dermatitis is under very good control at this point in time with intermittent use of topical steroid.  Usually in the summer after sunlight exposure she will sometimes develop a flareup.  She has been able to expand her eating repertoire to include various fruits and vegetables.  She still remains away from banana and watermelon consumption.  She did receive the flu vaccine this year.  Allergies as of 07/04/2019      Reactions   Codeine    Other Itching, Swelling   Oral pollinosis - fruit   Oxycodone       Medication List      beclomethasone 40 MCG/ACT inhaler Commonly known as: Qvar RediHaler Inhale 2 puffs into the lungs daily. What changed: additional instructions   BORIC ACID EX 1 capsule every 7 (seven) weeks.   CALCIUM-MAGNESIUM PO Take 2 tablets by mouth daily.   cetirizine 10 MG tablet Commonly known as: ZYRTEC Take 10 mg by mouth daily.   CULTURELLE PO Take by mouth.   EPINEPHrine 0.3 mg/0.3 mL  Soaj injection Commonly known as: Auvi-Q Use as directed for severe allergic reaction   levalbuterol 45 MCG/ACT inhaler Commonly known as: Xopenex HFA Inhale 1-2 puffs into the lungs every 6 (six) hours as needed for wheezing.   mometasone 0.1 % cream Commonly known as: ELOCON Use 1-2 times daily as needed   multivitamin tablet Take 1 tablet by mouth daily.       Past Medical History:  Diagnosis Date  . Abnormal Pap smear    ASCUS  . Anemia   . Asthma   . Dizziness 2005  . Dizziness 2005  . Dyspareunia   . Elevated prolactin level 2004  . Galactorrhea 2004  . Hematuria   . History of kidney stones   . Nausea 2005  . Rectal itching   . SOB (shortness of breath) 2005  . Vaginal burning 2006    History reviewed. No pertinent surgical history.  Review of systems negative except as noted in HPI / PMHx or noted below:  Review of Systems  Constitutional: Negative.   HENT: Negative.   Eyes: Negative.   Respiratory: Negative.   Cardiovascular: Negative.   Gastrointestinal: Negative.   Genitourinary: Negative.   Musculoskeletal: Negative.   Skin: Negative.   Neurological: Negative.   Endo/Heme/Allergies: Negative.   Psychiatric/Behavioral: Negative.      Objective:   Vitals:   07/04/19 1504  BP: 116/70  Resp: 18  Temp: (!) 97.3 F (36.3 C)  Physical Exam Constitutional:      Appearance: She is not diaphoretic.  HENT:     Head: Normocephalic.     Right Ear: Tympanic membrane, ear canal and external ear normal.     Left Ear: Tympanic membrane, ear canal and external ear normal.     Nose: Nose normal. No mucosal edema or rhinorrhea.     Mouth/Throat:     Pharynx: Uvula midline. No oropharyngeal exudate.  Eyes:     Conjunctiva/sclera: Conjunctivae normal.  Neck:     Thyroid: No thyromegaly.     Trachea: Trachea normal. No tracheal tenderness or tracheal deviation.  Cardiovascular:     Rate and Rhythm: Normal rate and regular rhythm.      Heart sounds: Normal heart sounds, S1 normal and S2 normal. No murmur.  Pulmonary:     Effort: No respiratory distress.     Breath sounds: Normal breath sounds. No stridor. No wheezing or rales.  Lymphadenopathy:     Head:     Right side of head: No tonsillar adenopathy.     Left side of head: No tonsillar adenopathy.     Cervical: No cervical adenopathy.  Skin:    Findings: No erythema or rash.     Nails: There is no clubbing.  Neurological:     Mental Status: She is alert.     Diagnostics:    Spirometry was performed and demonstrated an FEV1 of 2.03 at 66 % of predicted.  The patient had an Asthma Control Test with the following results: ACT Total Score: 25.    Assessment and Plan:   1. Asthma, well controlled, mild persistent   2. Other allergic rhinitis   3. Pollen-food allergy, subsequent encounter   4. Inflammatory dermatosis      1. Continue Qvar 40 REDIHALER 2 inhalation 3-7 times a week depending on disease activity. Increase to three inhalations three times per day during 'flare up'  2. Continue immunotherapy and AUVI-Q 0.3  3. If needed:   A.  Zyrtec   B.  Xopenex HFA  C.  Mometasone 0.1% Cream  4. Return in 6 months or earlier if problem.   5. Obtain fall flu vaccine  Brittany Thornton is really doing very well with her atopic driven respiratory tract issues on her current therapy which includes immunotherapy.  I did have a talk with her today about discontinuing immunotherapy as she has been using this form of therapy for over 5 years.  I did inform her that people who use 5 years of immunotherapy successfully will continue to do well without immunotherapy in 80% of cases with 20% relapsing.  At this point she has elected to just stay on immunotherapy.  We have refilled all of her medications and I will see her back in his clinic in 6 months or earlier if there is a problem.  [name], MD Allergy / Immunology [place]

## 2019-07-04 NOTE — Patient Instructions (Addendum)
  1. Continue Qvar 40 REDIHALER 2 inhalation 3-7 times a week depending on disease activity. Increase to three inhalations three times per day during 'flare up'  2. Continue immunotherapy and AUVI-Q 0.3  3. If needed:   A.  Zyrtec   B.  Xopenex HFA  C.  Mometasone 0.1% Cream  4. Return in 6 months or earlier if problem.   5. Obtain fall flu vaccine

## 2019-07-05 ENCOUNTER — Encounter: Payer: Self-pay | Admitting: Allergy and Immunology

## 2019-07-24 DIAGNOSIS — J301 Allergic rhinitis due to pollen: Secondary | ICD-10-CM

## 2019-07-24 NOTE — Progress Notes (Signed)
Vials exp 05-13-21 °

## 2019-08-03 ENCOUNTER — Ambulatory Visit (INDEPENDENT_AMBULATORY_CARE_PROVIDER_SITE_OTHER): Payer: BC Managed Care – PPO

## 2019-08-03 DIAGNOSIS — J309 Allergic rhinitis, unspecified: Secondary | ICD-10-CM | POA: Diagnosis not present

## 2019-09-07 ENCOUNTER — Ambulatory Visit (INDEPENDENT_AMBULATORY_CARE_PROVIDER_SITE_OTHER): Payer: BC Managed Care – PPO

## 2019-09-07 DIAGNOSIS — J309 Allergic rhinitis, unspecified: Secondary | ICD-10-CM | POA: Diagnosis not present

## 2019-09-21 ENCOUNTER — Ambulatory Visit (INDEPENDENT_AMBULATORY_CARE_PROVIDER_SITE_OTHER): Payer: BC Managed Care – PPO

## 2019-09-21 DIAGNOSIS — J309 Allergic rhinitis, unspecified: Secondary | ICD-10-CM | POA: Diagnosis not present

## 2019-09-28 ENCOUNTER — Ambulatory Visit (INDEPENDENT_AMBULATORY_CARE_PROVIDER_SITE_OTHER): Payer: BC Managed Care – PPO

## 2019-09-28 DIAGNOSIS — J309 Allergic rhinitis, unspecified: Secondary | ICD-10-CM

## 2019-10-10 ENCOUNTER — Ambulatory Visit (INDEPENDENT_AMBULATORY_CARE_PROVIDER_SITE_OTHER): Payer: BC Managed Care – PPO

## 2019-10-10 DIAGNOSIS — J309 Allergic rhinitis, unspecified: Secondary | ICD-10-CM

## 2019-10-19 ENCOUNTER — Ambulatory Visit (INDEPENDENT_AMBULATORY_CARE_PROVIDER_SITE_OTHER): Payer: BC Managed Care – PPO

## 2019-10-19 DIAGNOSIS — J309 Allergic rhinitis, unspecified: Secondary | ICD-10-CM | POA: Diagnosis not present

## 2019-10-24 ENCOUNTER — Ambulatory Visit (INDEPENDENT_AMBULATORY_CARE_PROVIDER_SITE_OTHER): Payer: BC Managed Care – PPO

## 2019-10-24 DIAGNOSIS — J309 Allergic rhinitis, unspecified: Secondary | ICD-10-CM

## 2019-11-09 ENCOUNTER — Ambulatory Visit (INDEPENDENT_AMBULATORY_CARE_PROVIDER_SITE_OTHER): Payer: BC Managed Care – PPO

## 2019-11-09 DIAGNOSIS — J309 Allergic rhinitis, unspecified: Secondary | ICD-10-CM

## 2019-11-16 ENCOUNTER — Ambulatory Visit (INDEPENDENT_AMBULATORY_CARE_PROVIDER_SITE_OTHER): Payer: BC Managed Care – PPO

## 2019-11-16 DIAGNOSIS — J309 Allergic rhinitis, unspecified: Secondary | ICD-10-CM

## 2019-11-21 ENCOUNTER — Ambulatory Visit (INDEPENDENT_AMBULATORY_CARE_PROVIDER_SITE_OTHER): Payer: BC Managed Care – PPO

## 2019-11-21 DIAGNOSIS — J309 Allergic rhinitis, unspecified: Secondary | ICD-10-CM | POA: Diagnosis not present

## 2019-12-26 ENCOUNTER — Ambulatory Visit (INDEPENDENT_AMBULATORY_CARE_PROVIDER_SITE_OTHER): Payer: BC Managed Care – PPO

## 2019-12-26 DIAGNOSIS — J309 Allergic rhinitis, unspecified: Secondary | ICD-10-CM | POA: Diagnosis not present

## 2019-12-29 ENCOUNTER — Emergency Department (HOSPITAL_COMMUNITY)
Admission: EM | Admit: 2019-12-29 | Discharge: 2019-12-29 | Disposition: A | Discharge status: Home / Self Care | Payer: BC Managed Care – PPO | Attending: Emergency Medicine | Admitting: Emergency Medicine

## 2019-12-29 ENCOUNTER — Encounter (HOSPITAL_COMMUNITY): Payer: Self-pay

## 2019-12-29 ENCOUNTER — Other Ambulatory Visit: Payer: Self-pay

## 2019-12-29 ENCOUNTER — Emergency Department (HOSPITAL_COMMUNITY): Payer: BC Managed Care – PPO

## 2019-12-29 DIAGNOSIS — J45909 Unspecified asthma, uncomplicated: Secondary | ICD-10-CM | POA: Diagnosis not present

## 2019-12-29 DIAGNOSIS — Z79899 Other long term (current) drug therapy: Secondary | ICD-10-CM | POA: Insufficient documentation

## 2019-12-29 DIAGNOSIS — R0789 Other chest pain: Secondary | ICD-10-CM | POA: Insufficient documentation

## 2019-12-29 LAB — CBC
HCT: 39.3 % (ref 36.0–46.0)
Hemoglobin: 12 g/dL (ref 12.0–15.0)
MCH: 26.4 pg (ref 26.0–34.0)
MCHC: 30.5 g/dL (ref 30.0–36.0)
MCV: 86.6 fL (ref 80.0–100.0)
Platelets: 295 10*3/uL (ref 150–400)
RBC: 4.54 MIL/uL (ref 3.87–5.11)
RDW: 13.2 % (ref 11.5–15.5)
WBC: 9.1 10*3/uL (ref 4.0–10.5)
nRBC: 0 % (ref 0.0–0.2)

## 2019-12-29 LAB — D-DIMER, QUANTITATIVE: D-Dimer, Quant: 0.42 ug/mL-FEU (ref 0.00–0.50)

## 2019-12-29 LAB — HEPATIC FUNCTION PANEL
ALT: 16 U/L (ref 0–44)
AST: 17 U/L (ref 15–41)
Albumin: 4.4 g/dL (ref 3.5–5.0)
Alkaline Phosphatase: 67 U/L (ref 38–126)
Bilirubin, Direct: <0.1 mg/dL (ref 0.0–0.2)
Total Bilirubin: 0.6 mg/dL (ref 0.3–1.2)
Total Protein: 7.7 g/dL (ref 6.5–8.1)

## 2019-12-29 LAB — BASIC METABOLIC PANEL
Anion gap: 10 (ref 5–15)
BUN: 13 mg/dL (ref 6–20)
CO2: 24 mmol/L (ref 22–32)
Calcium: 9.3 mg/dL (ref 8.9–10.3)
Chloride: 105 mmol/L (ref 98–111)
Creatinine, Ser: 0.69 mg/dL (ref 0.44–1.00)
GFR calc Af Amer: >60 mL/min (ref >60)
GFR calc non Af Amer: >60 mL/min (ref >60)
Glucose, Bld: 102 mg/dL — ABNORMAL HIGH (ref 70–99)
Potassium: 3.8 mmol/L (ref 3.5–5.1)
Sodium: 139 mmol/L (ref 135–145)

## 2019-12-29 LAB — TROPONIN I (HIGH SENSITIVITY)
Troponin I (High Sensitivity): 3 ng/L (ref <18)
Troponin I (High Sensitivity): <2 ng/L (ref <18)

## 2019-12-29 LAB — I-STAT BETA HCG BLOOD, ED (MC, WL, AP ONLY): I-stat hCG, quantitative: <5 m[IU]/mL (ref <5)

## 2019-12-29 MED ORDER — KETOROLAC TROMETHAMINE 30 MG/ML IJ SOLN
30.0000 mg | Freq: Once | INTRAMUSCULAR | Status: AC
  Administered 2019-12-29: 30 mg via INTRAVENOUS
  Filled 2019-12-29: qty 1

## 2019-12-29 MED ORDER — IOHEXOL 350 MG/ML SOLN
80.0000 mL | Freq: Once | INTRAVENOUS | Status: AC | PRN
  Administered 2019-12-29: 80 mL via INTRAVENOUS

## 2019-12-29 NOTE — ED Notes (Signed)
Pt verbalized understanding of d/c instructions, follow up care and s/s requiring return to ed. Pt ambulated to exit.

## 2019-12-29 NOTE — Discharge Instructions (Signed)
Take ibuprofen 600 mg every 6 hours as needed for pain.  Follow-up with your primary doctor if not improving in the next few days, and return to the ER if you develop high fever, severe chest pain, difficulty breathing, or other new and concerning symptoms.

## 2019-12-29 NOTE — ED Provider Notes (Signed)
[facility] EMERGENCY DEPARTMENT Provider Note   CSN: [id number] Arrival date & time: 12/29/19  1058     History Chief Complaint  Patient presents with   Chest Pain    Brittany Thornton is a 44 y.o. female.  Patient is a 44 year old female with past medical history of asthma and kidney stones.  She presents today for evaluation of chest discomfort.  Patient describes a heaviness to the center of her chest that has been present for the past 3 days.  She has felt short of breath and occasional palpitations.  She denies any fevers, chills, or cough.  She denies any leg pain or swelling.  Patient has no prior cardiac history and no cardiac risk factors.  The history is provided by the patient.  Chest Pain Pain location:  Substernal area Pain radiates to:  Mid back Pain severity:  Moderate Onset quality:  Sudden Duration:  3 days Timing:  Constant Progression:  Worsening Chronicity:  New Relieved by:  Nothing Worsened by:  Exertion Ineffective treatments:  None tried      Past Medical History:  Diagnosis Date   Abnormal Pap smear    ASCUS   Anemia    Asthma    Dizziness 2005   Dizziness 2005   Dyspareunia    Elevated prolactin level 2004   Galactorrhea 2004   Hematuria    History of kidney stones    Nausea 2005   Rectal itching    SOB (shortness of breath) 2005   Vaginal burning 2006    Patient Active Problem List   Diagnosis Date Noted   Rhinitis, allergic 03/23/2015   Oral allergy syndrome 03/23/2015   Abnormal PFTs 05/16/2013   Anemia 11/11/2011   Contraception 11/11/2011   Asthma 11/11/2011   History of kidney stones 11/11/2011    History reviewed. No pertinent surgical history.   OB History    Gravida  0   Para      Term      Preterm      AB      Living        SAB      TAB      Ectopic      Multiple      Live Births              Family History  Problem Relation Age of Onset    Asthma Sister    Heart disease Mother    Allergic rhinitis Neg Hx    Atopy Neg Hx    Angioedema Neg Hx    Eczema Neg Hx    Immunodeficiency Neg Hx    Urticaria Neg Hx     Social History   Tobacco Use   Smoking status: Never Smoker   Smokeless tobacco: Never Used  Substance Use Topics   Alcohol use: Not on file   Drug use: No    Home Medications Prior to Admission medications   Medication Sig Start Date End Date Taking? Authorizing Provider  beclomethasone (QVAR REDIHALER) 40 MCG/ACT inhaler Inhale 2 puffs into the lungs daily. Patient taking differently: Inhale 2 puffs into the lungs daily. Uses 2 puffs 2-3 times a week 12/28/17   Kozlow, Donnamarie Poag, MD  BORIC ACID EX 1 capsule every 7 (seven) weeks.    [provider]  CALCIUM-MAGNESIUM PO Take 2 tablets by mouth daily.    [provider]  cetirizine (ZYRTEC) 10 MG tablet Take 10 mg by mouth daily.  [provider]  EPINEPHrine (AUVI-Q) 0.3 mg/0.3 mL IJ SOAJ injection Use as directed for severe allergic reaction 12/28/17   Kozlow, Donnamarie Poag, MD  Lactobacillus Rhamnosus, GG, (CULTURELLE PO) Take by mouth.    [provider]  levalbuterol Penne Lash HFA) 45 MCG/ACT inhaler Inhale 1-2 puffs into the lungs every 6 (six) hours as needed for wheezing. 07/04/19   Kozlow, Donnamarie Poag, MD  mometasone (ELOCON) 0.1 % cream Use 1-2 times daily as needed 01/03/19   Kozlow, Donnamarie Poag, MD  Multiple Vitamin (MULTIVITAMIN) tablet Take 1 tablet by mouth daily.    [provider]  norethindrone-ethinyl estradiol (MICROGESTIN,JUNEL,LOESTRIN) 1-20 MG-MCG tablet Take 1 tablet by mouth daily. 11/11/11 04/27/12  Ena Dawley, MD    Allergies    Codeine, Other, and Oxycodone  Review of Systems   Review of Systems  Cardiovascular: Positive for chest pain.  All other systems reviewed and are negative.   Physical Exam Updated Vital Signs BP 110/72 (BP Location: Right Arm)    Pulse 78    Temp 98.2 F (36.8  C) (Oral)    Resp 14    Ht 5\' 2"  (1.575 m)    Wt 68 kg    LMP  (LMP Unknown)    SpO2 100%    BMI 27.44 kg/m   Physical Exam Vitals and nursing note reviewed.  Constitutional:      General: She is not in acute distress.    Appearance: She is well-developed. She is not diaphoretic.  HENT:     Head: Normocephalic and atraumatic.  Cardiovascular:     Rate and Rhythm: Normal rate and regular rhythm.     Heart sounds: No murmur heard.  No friction rub. No gallop.   Pulmonary:     Effort: Pulmonary effort is normal. No respiratory distress.     Breath sounds: Normal breath sounds. No wheezing.  Abdominal:     General: Bowel sounds are normal. There is no distension.     Palpations: Abdomen is soft.     Tenderness: There is no abdominal tenderness.  Musculoskeletal:        General: Normal range of motion.     Cervical back: Normal range of motion and neck supple.     Right lower leg: No tenderness. No edema.     Left lower leg: No tenderness. No edema.     Comments: Homan's sign is absent bilaterally.  Skin:    General: Skin is warm and dry.  Neurological:     Mental Status: She is alert and oriented to person, place, and time.     ED Results / Procedures / Treatments   Labs (all labs ordered are listed, but only abnormal results are displayed) Labs Reviewed  BASIC METABOLIC PANEL - Abnormal; Notable for the following components:      Result Value   Glucose, Bld 102 (*)    All other components within normal limits  CBC  I-STAT BETA HCG BLOOD, ED ([facility], [facility], [facility] ONLY)  TROPONIN I (HIGH SENSITIVITY)  TROPONIN I (HIGH SENSITIVITY)    EKG EKG Interpretation  Date/Time:  Friday December 29 2019 11:03:58 EDT Ventricular Rate:  85 PR Interval:  144 QRS Duration: 74 QT Interval:  338 QTC Calculation: 402 R Axis:   46 Text Interpretation: Normal sinus rhythm Low voltage QRS Cannot rule out Anterior infarct , age undetermined T wave abnormality, consider inferior ischemia Abnormal  ECG No prior ecg for comparison Confirmed by Veryl Speak [phone]) on 12/29/2019 5:56:11  PM   Radiology DG Chest 2 View  Result Date: 12/29/2019 CLINICAL DATA:  Chest pain and shortness of breath.  Racing heart. EXAM: CHEST - 2 VIEW COMPARISON:  None. FINDINGS: A calcified granuloma is seen in the left apex. The heart, hila, mediastinum, lungs, and pleura are unremarkable. No edema. No focal infiltrate. IMPRESSION: No active cardiopulmonary disease. Electronically Signed   By: Dorise Bullion III M.D   On: 12/29/2019 12:35    Procedures Procedures (including critical care time)  Medications Ordered in ED Medications - No data to display  ED Course  I have reviewed the triage vital signs and the nursing notes.  Pertinent labs & imaging results that were available during my care of the patient were reviewed by me and considered in my medical decision making (see chart for details).    MDM Rules/Calculators/A&P  Patient is a 44 year old female presenting with a 3-day history of tightness in her chest and shortness of breath.  Patient arrives here with stable vital signs, no fever, no hypoxia, and an unchanged EKG.  Work-up was initiated including EKG, troponin x2, D-dimer, chest x-ray, and PE study.  These have all been performed and are negative.  Her LFTs are unremarkable and symptoms do not sound like her gallbladder.  She has no white count, no fever, and no tenderness to the right upper quadrant.  At this point, I am uncertain as to the exact etiology of her symptoms, however nothing today appears emergent.  I feel as though she is appropriate for discharge.  Patient to take ibuprofen as needed for pain.  She is to follow-up with primary doctor if her symptoms or not improving in the next few days, and return if she worsens.  Final Clinical Impression(s) / ED Diagnoses Final diagnoses:  None    Rx / DC Orders ED Discharge Orders    None       Veryl Speak, MD 12/29/19  2140

## 2019-12-29 NOTE — ED Triage Notes (Signed)
Pt arrives POV for eval of chest pain and SOB x 3 days. Pt reports pain is relieved w/ movement or ambulation. Pt also endorses lower back soreness for same amt of time. Reports pain is constant pressure in chest/epigastric region. Denies hx of similar.

## 2020-01-02 ENCOUNTER — Other Ambulatory Visit: Payer: Self-pay

## 2020-01-02 ENCOUNTER — Encounter: Payer: Self-pay | Admitting: Allergy and Immunology

## 2020-01-02 ENCOUNTER — Ambulatory Visit: Payer: BC Managed Care – PPO | Admitting: Allergy and Immunology

## 2020-01-02 VITALS — BP 92/60 | HR 75 | Resp 18 | Ht 62.0 in | Wt 153.6 lb

## 2020-01-02 DIAGNOSIS — T781XXD Other adverse food reactions, not elsewhere classified, subsequent encounter: Secondary | ICD-10-CM

## 2020-01-02 DIAGNOSIS — L308 Other specified dermatitis: Secondary | ICD-10-CM

## 2020-01-02 DIAGNOSIS — L989 Disorder of the skin and subcutaneous tissue, unspecified: Secondary | ICD-10-CM

## 2020-01-02 DIAGNOSIS — J4530 Mild persistent asthma, uncomplicated: Secondary | ICD-10-CM | POA: Diagnosis not present

## 2020-01-02 DIAGNOSIS — J3089 Other allergic rhinitis: Secondary | ICD-10-CM | POA: Diagnosis not present

## 2020-01-02 DIAGNOSIS — K219 Gastro-esophageal reflux disease without esophagitis: Secondary | ICD-10-CM

## 2020-01-02 MED ORDER — EPINEPHRINE 0.3 MG/0.3ML IJ SOAJ: INTRAMUSCULAR | 1 refills | Status: DC

## 2020-01-02 MED ORDER — LEVALBUTEROL TARTRATE 45 MCG/ACT IN AERO: 1.0000 | INHALATION_SPRAY | Freq: Four times a day (QID) | RESPIRATORY_TRACT | 1 refills | Status: DC | PRN

## 2020-01-02 MED ORDER — MOMETASONE FUROATE 0.1 % EX CREA: TOPICAL_CREAM | CUTANEOUS | 2 refills | Status: DC

## 2020-01-02 MED ORDER — QVAR REDIHALER 40 MCG/ACT IN AERB: 2.0000 | INHALATION_SPRAY | Freq: Every day | RESPIRATORY_TRACT | 5 refills | Status: DC

## 2020-01-02 NOTE — Patient Instructions (Addendum)
°  1. Continue Qvar 40 REDIHALER 2 inhalation 3-7 times a week depending on disease activity. Increase to three inhalations three times per day during 'flare up'  2. Continue immunotherapy and AUVI-Q 0.3  3. If needed:   A.  Zyrtec   B.  Xopenex HFA  C.  Mometasone 0.1% Cream  4. Treat reflux:   A. Consolidate caffeine as much as possible  B. Omeprazole 40 mg - 1 tablet in AM  C. Famotidine 40 mg in PM  5.  Return in 4 weeks or earlier if problem.

## 2020-01-02 NOTE — Progress Notes (Signed)
[facility] [place] [facility] [place]   Follow-up Note  Referring Provider: Donald Prose, MD Primary Provider: Donald Prose, MD Date of Office Visit: 01/02/2020  Subjective:   Brittany Thornton (DOB: 1975/08/28) is a 44 y.o. female who returns to the [facility] on 01/02/2020 in re-evaluation of the following:  HPI: Brittany Thornton returns to this clinic in of reevaluation of asthma and allergic rhinoconjunctivitis and oral allergy syndrome and a history of atopic dermatitis.  Her last visit to this clinic was 04 July 2019.  She was really doing well with her airway and was intermittently using some Qvar and rarely using any short acting bronchodilator and had no problems with ability to exercise and did not require systemic steroid to address an exacerbation of her asthma.  Likewise, her nose was really doing quite well with some intermittent antihistamine and her atopic dermatitis was not flaring requiring her to use mometasone very infrequently. Her immunotherapy is going quite well currently at every 4 weeks without any adverse effect.  However, it sounds as though she ended up in the emergency room last week for she developed an issue with chest discomfort.  Tuesday of last week she started to develop the sternal chest discomfort that appeared to migrate down to her stomach and maybe to her back and was associated with "racing heart" and she developed some shortness of breath.  She also developed thoracic and lumbar pain.  She went to her chiropractor on Thursday and apparently she was out of "alignment".  She then ended up in the emergency room on Friday where a thorough evaluation was performed including a chest x-ray which identified no significant abnormality.  She is slowly getting better but at this point time she still feels as though she is having some shortness of breath although that is not her overwhelming complaint.  Her overwhelming complaint is  that she just does not feel right down in her abdominal area.  She feels bloated and she feels as though her food is not digesting well and she still has this ill-defined discomfort around her epigastric region.  It should be noted that she ate chocolate cake on Sunday, Monday, Tuesday, and Wednesday of last week which is an unusual food for her.  She also consumes a cup and a half a coffee in the morning and drinks green tea in the afternoon.  Allergies as of 01/02/2020      Reactions   Codeine    Other Itching, Swelling   Oral pollinosis - fruit   Oxycodone       Medication List      beclomethasone 40 MCG/ACT inhaler Commonly known as: Qvar RediHaler Inhale 2 puffs into the lungs daily.   BORIC ACID EX 1 capsule every 7 (seven) weeks.   CALCIUM-MAGNESIUM PO Take 2 tablets by mouth daily.   cetirizine 10 MG tablet Commonly known as: ZYRTEC Take 10 mg by mouth daily.   CULTURELLE PO Take by mouth.   EPINEPHrine 0.3 mg/0.3 mL Soaj injection Commonly known as: Auvi-Q Use as directed for severe allergic reaction   levalbuterol 45 MCG/ACT inhaler Commonly known as: Xopenex HFA Inhale 1-2 puffs into the lungs every 6 (six) hours as needed for wheezing.   mometasone 0.1 % cream Commonly known as: ELOCON Use 1-2 times daily as needed   multivitamin tablet Take 1 tablet by mouth daily.       Past Medical History:  Diagnosis Date  . Abnormal Pap  smear    ASCUS  . Anemia   . Asthma   . Dizziness 2005  . Dizziness 2005  . Dyspareunia   . Elevated prolactin level 2004  . Galactorrhea 2004  . Hematuria   . History of kidney stones   . Nausea 2005  . Rectal itching   . SOB (shortness of breath) 2005  . Vaginal burning 2006    History reviewed. No pertinent surgical history.  Review of systems negative except as noted in HPI / PMHx or noted below:  Review of Systems  Constitutional: Negative.   HENT: Negative.   Eyes: Negative.   Respiratory: Negative.     Cardiovascular: Negative.   Gastrointestinal: Negative.   Genitourinary: Negative.   Musculoskeletal: Negative.   Skin: Negative.   Neurological: Negative.   Endo/Heme/Allergies: Negative.   Psychiatric/Behavioral: Negative.      Objective:   Vitals:   01/02/20 1504  BP: 92/60  Pulse: 75  Resp: 18  SpO2: 98%   Height: 5\' 2"  (157.5 cm)  Weight: 153 lb 9.6 oz (69.7 kg)   Physical Exam Constitutional:      Appearance: She is not diaphoretic.  HENT:     Head: Normocephalic.     Right Ear: Tympanic membrane, ear canal and external ear normal.     Left Ear: Tympanic membrane, ear canal and external ear normal.     Nose: Nose normal. No mucosal edema or rhinorrhea.     Mouth/Throat:     Pharynx: Uvula midline. No oropharyngeal exudate.  Eyes:     Conjunctiva/sclera: Conjunctivae normal.  Neck:     Thyroid: No thyromegaly.     Trachea: Trachea normal. No tracheal tenderness or tracheal deviation.  Cardiovascular:     Rate and Rhythm: Normal rate and regular rhythm.     Heart sounds: Normal heart sounds, S1 normal and S2 normal. No murmur heard.   Pulmonary:     Effort: No respiratory distress.     Breath sounds: Normal breath sounds. No stridor. No wheezing or rales.  Lymphadenopathy:     Head:     Right side of head: No tonsillar adenopathy.     Left side of head: No tonsillar adenopathy.     Cervical: No cervical adenopathy.  Skin:    Findings: No erythema or rash.     Nails: There is no clubbing.  Neurological:     Mental Status: She is alert.     Diagnostics:     Spirometry was performed and demonstrated an FEV1 of 1.93 at 71 % of predicted.  The patient had an Asthma Control Test with the following results: ACT Total Score: 5.    Assessment and Plan:   1. Asthma, well controlled, mild persistent   2. Other allergic rhinitis   3. Pollen-food allergy, subsequent encounter   4. Inflammatory dermatosis   5. Gastroesophageal reflux disease, unspecified  whether esophagitis present      1. Continue Qvar 40 REDIHALER 2 inhalation 3-7 times a week depending on disease activity. Increase to three inhalations three times per day during 'flare up'  2. Continue immunotherapy and AUVI-Q 0.3  3. If needed:   A.  Zyrtec   B.  Xopenex HFA  C.  Mometasone 0.1% Cream  4. Treat reflux:   A. Consolidate caffeine as much as possible  B. Omeprazole 40 mg - 1 tablet in AM  C. Famotidine 40 mg in PM  5.  Return in 4 weeks or earlier if problem.  Brittany Thornton appears to have developed some issues with her gastrointestinal tract that is giving rise to chest pain and back pain and a respiratory issue. I suspect that this is reflux that has flared as a result of her eating chocolate cake 4 days in a row immediately prior to the onset of this syndrome. We will treat her pretty aggressively with anti-reflux therapy as noted above in an attempt to address this issue and see how she does over the course of the next several weeks prior to pursuing any further evaluation or treatment. She will keep in contact with you noting her response to this approach.  [name], MD Allergy / Immunology [place]

## 2020-01-03 ENCOUNTER — Encounter: Payer: Self-pay | Admitting: Allergy and Immunology

## 2020-01-08 ENCOUNTER — Other Ambulatory Visit: Payer: Self-pay

## 2020-01-08 MED ORDER — EPINEPHRINE 0.3 MG/0.3ML IJ SOAJ: INTRAMUSCULAR | 1 refills | Status: DC

## 2020-01-23 ENCOUNTER — Ambulatory Visit (INDEPENDENT_AMBULATORY_CARE_PROVIDER_SITE_OTHER): Payer: BC Managed Care – PPO

## 2020-01-23 DIAGNOSIS — J309 Allergic rhinitis, unspecified: Secondary | ICD-10-CM

## 2020-01-23 DIAGNOSIS — T781XXD Other adverse food reactions, not elsewhere classified, subsequent encounter: Secondary | ICD-10-CM

## 2020-01-24 NOTE — Progress Notes (Signed)
EXP 01/23/21

## 2020-01-25 DIAGNOSIS — J301 Allergic rhinitis due to pollen: Secondary | ICD-10-CM

## 2020-02-06 ENCOUNTER — Ambulatory Visit: Payer: BC Managed Care – PPO | Admitting: Allergy and Immunology

## 2020-02-06 ENCOUNTER — Other Ambulatory Visit: Payer: Self-pay

## 2020-02-06 ENCOUNTER — Encounter: Payer: Self-pay | Admitting: Allergy and Immunology

## 2020-02-06 VITALS — BP 110/72 | HR 67 | Temp 98.3°F | Resp 16

## 2020-02-06 DIAGNOSIS — L989 Disorder of the skin and subcutaneous tissue, unspecified: Secondary | ICD-10-CM

## 2020-02-06 DIAGNOSIS — J3089 Other allergic rhinitis: Secondary | ICD-10-CM | POA: Diagnosis not present

## 2020-02-06 DIAGNOSIS — L308 Other specified dermatitis: Secondary | ICD-10-CM

## 2020-02-06 DIAGNOSIS — J4530 Mild persistent asthma, uncomplicated: Secondary | ICD-10-CM | POA: Diagnosis not present

## 2020-02-06 DIAGNOSIS — T781XXD Other adverse food reactions, not elsewhere classified, subsequent encounter: Secondary | ICD-10-CM

## 2020-02-06 DIAGNOSIS — K219 Gastro-esophageal reflux disease without esophagitis: Secondary | ICD-10-CM

## 2020-02-06 MED ORDER — FAMOTIDINE 40 MG PO TABS
ORAL_TABLET | ORAL | 5 refills | Status: DC
Start: 2020-02-06 — End: 2020-10-29

## 2020-02-06 MED ORDER — OMEPRAZOLE 40 MG PO CPDR
DELAYED_RELEASE_CAPSULE | ORAL | 5 refills | Status: DC
Start: 2020-02-06 — End: 2020-10-28

## 2020-02-06 NOTE — Progress Notes (Signed)
[facility]   Follow-up Note   Referring Provider: Donald Prose, MD Primary Provider: Donald Prose, MD Date of Office Visit: 02/06/2020  Subjective:   Brittany Thornton (DOB: 12-02-1975) is a 44 y.o. female who returns to the [facility] on 02/06/2020 in re-evaluation of the following:  HPI: Brittany Thornton returns to this clinic in reevaluation of asthma and allergic rhinoconjunctivitis and oral allergy syndrome and history of atopic dermatitis and an issue of "chest discomfort" that was addressed during her last evaluation of 02 January 2020.  She has completely resolved all of her chest discomfort in her epigastric discomfort.  She has dramatically consolidated her caffeine consumption to 1 cup of coffee per day and has dramatically consolidated her caffeine consumption in the form of chocolate by probably 90%.  She continues to use a proton pump inhibitor and H2 receptor blocker on a consistent basis and has had no classic reflux.  As noted during her last visit her airway issue is under complete control without any limitation on ability to exercise and no need to use a short acting bronchodilator and has had no issues with her nose.  She is able to eat most fruits and vegetables at this point without any problem.  She continues on Qvar on a regular basis.  Her immunotherapy is going quite well at every 4 weeks without any adverse effect.  She has received 2 Pfizer Covid vaccinations.  Allergies as of 02/06/2020      Reactions   Codeine    Other Itching, Swelling   Oral pollinosis - fruit   Oxycodone       Medication List      BORIC ACID EX 1 capsule every 7 (seven) weeks.   CALCIUM-MAGNESIUM PO Take 2 tablets by mouth daily.   cetirizine 10 MG tablet Commonly known as: ZYRTEC Take 10 mg by mouth daily.   CULTURELLE PO Take by mouth.   EPINEPHrine 0.3 mg/0.3 mL Soaj injection Commonly known as: Auvi-Q Use as  directed for severe allergic reaction   levalbuterol 45 MCG/ACT inhaler Commonly known as: Xopenex HFA Inhale 1-2 puffs into the lungs every 6 (six) hours as needed for wheezing.   mometasone 0.1 % cream Commonly known as: ELOCON Use 1-2 times daily as needed   multivitamin tablet Take 1 tablet by mouth daily.   Qvar RediHaler 40 MCG/ACT inhaler Generic drug: beclomethasone Inhale 2 puffs into the lungs daily.       Past Medical History:  Diagnosis Date  . Abnormal Pap smear    ASCUS  . Anemia   . Asthma   . Dizziness 2005  . Dizziness 2005  . Dyspareunia   . Elevated prolactin level 2004  . Galactorrhea 2004  . Hematuria   . History of kidney stones   . Nausea 2005  . Rectal itching   . SOB (shortness of breath) 2005  . Vaginal burning 2006    History reviewed. No pertinent surgical history.  Review of systems negative except as noted in HPI / PMHx or noted below:  Review of Systems  Constitutional: Negative.   HENT: Negative.   Eyes: Negative.   Respiratory: Negative.   Cardiovascular: Negative.   Gastrointestinal: Negative.   Genitourinary: Negative.   Musculoskeletal: Negative.   Skin: Negative.   Neurological: Negative.   Endo/Heme/Allergies: Negative.   Psychiatric/Behavioral: Negative.      Objective:   Vitals:   02/06/20 1623  BP: 110/72  Pulse: 67  Resp: 16  Temp: 98.3 F (36.8 C)  SpO2: 99%          Physical Exam Constitutional:      Appearance: She is not diaphoretic.  HENT:     Head: Normocephalic.     Right Ear: Tympanic membrane, ear canal and external ear normal.     Left Ear: Tympanic membrane, ear canal and external ear normal.     Nose: Nose normal. No mucosal edema or rhinorrhea.     Mouth/Throat:     Pharynx: Uvula midline. No oropharyngeal exudate.  Eyes:     Conjunctiva/sclera: Conjunctivae normal.  Neck:     Thyroid: No thyromegaly.     Trachea: Trachea normal. No tracheal tenderness or tracheal deviation.   Cardiovascular:     Rate and Rhythm: Normal rate and regular rhythm.     Heart sounds: Normal heart sounds, S1 normal and S2 normal. No murmur heard.   Pulmonary:     Effort: No respiratory distress.     Breath sounds: Normal breath sounds. No stridor. No wheezing or rales.  Lymphadenopathy:     Head:     Right side of head: No tonsillar adenopathy.     Left side of head: No tonsillar adenopathy.     Cervical: No cervical adenopathy.  Skin:    Findings: No erythema or rash.     Nails: There is no clubbing.  Neurological:     Mental Status: She is alert.     Diagnostics:    Spirometry was performed and demonstrated an FEV1 of 1.87 at 68 % of predicted.   Assessment and Plan:   1. Asthma, well controlled, mild persistent   2. Other allergic rhinitis   3. Pollen-food allergy, subsequent encounter   4. Inflammatory dermatosis   5. Gastroesophageal reflux disease, unspecified whether esophagitis present      1. Continue Qvar 40 REDIHALER 2 inhalation 3-7 times a week depending on disease activity. Increase to three inhalations three times per day during 'flare up'  2. Continue immunotherapy and AUVI-Q 0.3  3. If needed:   A.  Zyrtec   B.  Xopenex HFA  C.  Mometasone 0.1% Cream  4. Treat reflux:   A. Consolidate caffeine as much as possible  B. Omeprazole 40 mg - 1 tablet in AM  C. Famotidine 40 mg in PM  5.  Return in 12 weeks or earlier if problem.   6.  Obtain fall flu vaccine   Brittany Thornton appears to be doing very well on her current therapy.  She will continue on inhaled steroids with the dosage dependent on the extent of her disease activity and she will continue to aggressively treat her reflux as noted above.  I would like for her to continue on this plan for a full 12 weeks and at that point in time there may be an opportunity to consolidate her treatment especially regarding her reflux as she has now modified her caffeine and chocolate consumption  significantly.  Brittany Katz, MD Allergy / Immunology [place]

## 2020-02-06 NOTE — Patient Instructions (Signed)
  1. Continue Qvar 40 REDIHALER 2 inhalation 3-7 times a week depending on disease activity. Increase to three inhalations three times per day during 'flare up'  2. Continue immunotherapy and AUVI-Q 0.3  3. If needed:   A.  Zyrtec   B.  Xopenex HFA  C.  Mometasone 0.1% Cream  4. Treat reflux:   A. Consolidate caffeine as much as possible  B. Omeprazole 40 mg - 1 tablet in AM  C. Famotidine 40 mg in PM  5.  Return in 12 weeks or earlier if problem.   6.  Obtain fall flu vaccine

## 2020-02-07 ENCOUNTER — Encounter: Payer: Self-pay | Admitting: Allergy and Immunology

## 2020-03-01 ENCOUNTER — Ambulatory Visit (INDEPENDENT_AMBULATORY_CARE_PROVIDER_SITE_OTHER): Payer: BC Managed Care – PPO | Admitting: *Deleted

## 2020-03-01 DIAGNOSIS — J309 Allergic rhinitis, unspecified: Secondary | ICD-10-CM

## 2020-03-12 ENCOUNTER — Ambulatory Visit (INDEPENDENT_AMBULATORY_CARE_PROVIDER_SITE_OTHER): Payer: BC Managed Care – PPO

## 2020-03-12 DIAGNOSIS — J309 Allergic rhinitis, unspecified: Secondary | ICD-10-CM

## 2020-03-19 ENCOUNTER — Ambulatory Visit (INDEPENDENT_AMBULATORY_CARE_PROVIDER_SITE_OTHER): Payer: BC Managed Care – PPO

## 2020-03-19 DIAGNOSIS — J309 Allergic rhinitis, unspecified: Secondary | ICD-10-CM

## 2020-04-02 ENCOUNTER — Ambulatory Visit (INDEPENDENT_AMBULATORY_CARE_PROVIDER_SITE_OTHER): Payer: BC Managed Care – PPO | Admitting: *Deleted

## 2020-04-02 DIAGNOSIS — J309 Allergic rhinitis, unspecified: Secondary | ICD-10-CM | POA: Diagnosis not present

## 2020-04-09 ENCOUNTER — Ambulatory Visit (INDEPENDENT_AMBULATORY_CARE_PROVIDER_SITE_OTHER): Payer: BC Managed Care – PPO | Admitting: *Deleted

## 2020-04-09 DIAGNOSIS — J309 Allergic rhinitis, unspecified: Secondary | ICD-10-CM

## 2020-04-23 ENCOUNTER — Ambulatory Visit (INDEPENDENT_AMBULATORY_CARE_PROVIDER_SITE_OTHER): Payer: BC Managed Care – PPO

## 2020-04-23 DIAGNOSIS — J309 Allergic rhinitis, unspecified: Secondary | ICD-10-CM

## 2020-04-30 ENCOUNTER — Other Ambulatory Visit: Payer: Self-pay

## 2020-04-30 ENCOUNTER — Ambulatory Visit: Payer: BC Managed Care – PPO | Admitting: Allergy and Immunology

## 2020-04-30 ENCOUNTER — Encounter: Payer: Self-pay | Admitting: Allergy and Immunology

## 2020-04-30 VITALS — BP 118/62 | HR 92 | Temp 98.2°F | Resp 18

## 2020-04-30 DIAGNOSIS — J3089 Other allergic rhinitis: Secondary | ICD-10-CM

## 2020-04-30 DIAGNOSIS — T781XXD Other adverse food reactions, not elsewhere classified, subsequent encounter: Secondary | ICD-10-CM

## 2020-04-30 DIAGNOSIS — L989 Disorder of the skin and subcutaneous tissue, unspecified: Secondary | ICD-10-CM | POA: Diagnosis not present

## 2020-04-30 DIAGNOSIS — K219 Gastro-esophageal reflux disease without esophagitis: Secondary | ICD-10-CM

## 2020-04-30 DIAGNOSIS — J4530 Mild persistent asthma, uncomplicated: Secondary | ICD-10-CM

## 2020-04-30 NOTE — Progress Notes (Signed)
[facility] [place] [facility] [place]   Follow-up Note  Referring Provider: Donald Prose, MD Primary Provider: Donald Prose, MD Date of Office Visit: 04/30/2020  Subjective:   Brittany Thornton (DOB: [birth year]) is a 44 y.o. female who returns to the [facility] on 04/30/2020 in re-evaluation of the following:  HPI: Chanci returns to this clinic in evaluation of asthma and allergic rhinoconjunctivitis and oral allergy syndrome and history of atopic dermatitis and reflux.  Her last visit to this clinic was 06 February 2020.  She continues to do very well with her airway and rarely uses a short acting bronchodilator and can exercise without any problem while continuing to use a relatively low dose of an inhaled steroid.  Likewise, her nose has really been doing quite well while she continues on immunotherapy and Zyrtec.  Her reflux has been under excellent control and the "chest discomfort" that she had previously has completely resolved.  As noted during her last visit she has dramatically consolidated her caffeine and rarely consumes any chocolate.  Her immunotherapy is going quite well without any significant problems.  She has expanded her eating into berries and apples and grapes with no difficulty at all.  She still gets a little bit of a mouth issue when eating bananas and watermelon.  She has not had any issues with her skin since her last visit and has not had to use any topical mometasone.  Allergies as of 04/30/2020      Reactions   Codeine    Other Itching, Swelling   Oral pollinosis - fruit   Oxycodone       Medication List      BORIC ACID EX 1 capsule every 7 (seven) weeks.   CALCIUM-MAGNESIUM PO Take 2 tablets by mouth daily.   cetirizine 10 MG tablet Commonly known as: ZYRTEC Take 10 mg by mouth daily.   CULTURELLE PO Take by mouth.   EPINEPHrine 0.3 mg/0.3 mL Soaj injection Commonly known as: Auvi-Q Use as directed  for severe allergic reaction   famotidine 40 MG tablet Commonly known as: PEPCID Take 1 tablet daily in the afternoon   levalbuterol 45 MCG/ACT inhaler Commonly known as: Xopenex HFA Inhale 1-2 puffs into the lungs every 6 (six) hours as needed for wheezing.   mometasone 0.1 % cream Commonly known as: ELOCON Use 1-2 times daily as needed   multivitamin tablet Take 1 tablet by mouth daily.   omeprazole 40 MG capsule Commonly known as: PRILOSEC Take 1 tablet daily in the morning   Qvar RediHaler 40 MCG/ACT inhaler Generic drug: beclomethasone Inhale 2 puffs into the lungs daily.       Past Medical History:  Diagnosis Date  . Abnormal Pap smear    ASCUS  . Anemia   . Asthma   . Dizziness 2005  . Dizziness 2005  . Dyspareunia   . Elevated prolactin level 2004  . Galactorrhea 2004  . Hematuria   . History of kidney stones   . Nausea 2005  . Rectal itching   . SOB (shortness of breath) 2005  . Vaginal burning 2006    History reviewed. No pertinent surgical history.  Review of systems negative except as noted in HPI / PMHx or noted below:  Review of Systems  Constitutional: Negative.   HENT: Negative.   Eyes: Negative.   Respiratory: Negative.   Cardiovascular: Negative.   Gastrointestinal: Negative.   Genitourinary: Negative.   Musculoskeletal: Negative.  Skin: Negative.   Neurological: Negative.   Endo/Heme/Allergies: Negative.   Psychiatric/Behavioral: Negative.      Objective:   Vitals:   04/30/20 1607  BP: 118/62  Pulse: 92  Resp: 18  Temp: 98.2 F (36.8 C)  SpO2: 100%          Physical Exam Constitutional:      Appearance: She is not diaphoretic.  HENT:     Head: Normocephalic.     Right Ear: Tympanic membrane, ear canal and external ear normal.     Left Ear: Tympanic membrane, ear canal and external ear normal.     Nose: Nose normal. No mucosal edema or rhinorrhea.     Mouth/Throat:     Pharynx: Uvula midline. No oropharyngeal  exudate.  Eyes:     Conjunctiva/sclera: Conjunctivae normal.  Neck:     Thyroid: No thyromegaly.     Trachea: Trachea normal. No tracheal tenderness or tracheal deviation.  Cardiovascular:     Rate and Rhythm: Normal rate and regular rhythm.     Heart sounds: Normal heart sounds, S1 normal and S2 normal. No murmur heard.   Pulmonary:     Effort: No respiratory distress.     Breath sounds: Normal breath sounds. No stridor. No wheezing or rales.  Lymphadenopathy:     Head:     Right side of head: No tonsillar adenopathy.     Left side of head: No tonsillar adenopathy.     Cervical: No cervical adenopathy.  Skin:    Findings: No erythema or rash.     Nails: There is no clubbing.  Neurological:     Mental Status: She is alert.     Diagnostics:    Spirometry was performed and demonstrated an FEV1 of 2.03 at 81 % of predicted.  The patient had an Asthma Control Test with the following results: ACT Total Score: 24.    Assessment and Plan:   1. Asthma, well controlled, mild persistent   2. Other allergic rhinitis   3. Pollen-food allergy, subsequent encounter   4. Inflammatory dermatosis   5. Gastroesophageal reflux disease, unspecified whether esophagitis present      1. Continue Qvar 40 REDIHALER 2 inhalation 3-7 times a week depending on disease activity. Increase to three inhalations three times per day during 'flare up'  2. Continue immunotherapy and AUVI-Q 0.3  3. If needed:   A.  Zyrtec   B.  Xopenex HFA  C.  Mometasone 0.1% Cream  4. Treat reflux:   A. Consolidate caffeine as much as possible  B. Omeprazole 40 mg - 1 tablet in AM  C. Attempt to DISCONTINUE Famotidine 40 mg   5.  Return in 6 months or earlier if problem.   6.  Obtain fall flu vaccine   Annamary appears to be doing very well with her current plan of anti-inflammatory medications for her airway and therapy directed against reflux and continuing on immunotherapy.  I believe that there is an  opportunity today to consolidate some of her treatment today. Because she has made significant lifestyle changes by eliminating caffeine consumption I suspect that she will be able to control her reflux even as we discontinue the use of famotidine.  Assuming that is the case I will see her back in this clinic in 6 months or earlier if there is a problem.  [name], MD Allergy / Immunology [place]

## 2020-04-30 NOTE — Patient Instructions (Addendum)
  1. Continue Qvar 40 REDIHALER 2 inhalation 3-7 times a week depending on disease activity. Increase to three inhalations three times per day during 'flare up'  2. Continue immunotherapy and AUVI-Q 0.3  3. If needed:   A.  Zyrtec   B.  Xopenex HFA  C.  Mometasone 0.1% Cream  4. Treat reflux:   A. Consolidate caffeine as much as possible  B. Omeprazole 40 mg - 1 tablet in AM  C. Attempt to DISCONTINUE Famotidine 40 mg   5.  Return in 6 months or earlier if problem.   6.  Obtain fall flu vaccine

## 2020-05-01 ENCOUNTER — Encounter: Payer: Self-pay | Admitting: Allergy and Immunology

## 2020-05-16 ENCOUNTER — Ambulatory Visit (INDEPENDENT_AMBULATORY_CARE_PROVIDER_SITE_OTHER): Payer: BC Managed Care – PPO

## 2020-05-16 DIAGNOSIS — J309 Allergic rhinitis, unspecified: Secondary | ICD-10-CM

## 2020-05-21 ENCOUNTER — Ambulatory Visit (INDEPENDENT_AMBULATORY_CARE_PROVIDER_SITE_OTHER): Payer: BC Managed Care – PPO | Admitting: *Deleted

## 2020-05-21 DIAGNOSIS — J309 Allergic rhinitis, unspecified: Secondary | ICD-10-CM | POA: Diagnosis not present

## 2020-06-25 ENCOUNTER — Ambulatory Visit (INDEPENDENT_AMBULATORY_CARE_PROVIDER_SITE_OTHER): Payer: BC Managed Care – PPO | Admitting: *Deleted

## 2020-06-25 DIAGNOSIS — J309 Allergic rhinitis, unspecified: Secondary | ICD-10-CM | POA: Diagnosis not present

## 2020-08-15 ENCOUNTER — Ambulatory Visit (INDEPENDENT_AMBULATORY_CARE_PROVIDER_SITE_OTHER): Payer: BC Managed Care – PPO

## 2020-08-15 DIAGNOSIS — J309 Allergic rhinitis, unspecified: Secondary | ICD-10-CM

## 2020-08-20 ENCOUNTER — Ambulatory Visit (INDEPENDENT_AMBULATORY_CARE_PROVIDER_SITE_OTHER): Payer: BC Managed Care – PPO | Admitting: *Deleted

## 2020-08-20 DIAGNOSIS — J309 Allergic rhinitis, unspecified: Secondary | ICD-10-CM | POA: Diagnosis not present

## 2020-08-26 DIAGNOSIS — J301 Allergic rhinitis due to pollen: Secondary | ICD-10-CM

## 2020-08-26 NOTE — Progress Notes (Signed)
VIALS EXP 08-26-21 

## 2020-08-27 ENCOUNTER — Ambulatory Visit (INDEPENDENT_AMBULATORY_CARE_PROVIDER_SITE_OTHER): Payer: BC Managed Care – PPO | Admitting: *Deleted

## 2020-08-27 DIAGNOSIS — J309 Allergic rhinitis, unspecified: Secondary | ICD-10-CM

## 2020-09-05 ENCOUNTER — Ambulatory Visit (INDEPENDENT_AMBULATORY_CARE_PROVIDER_SITE_OTHER): Payer: BC Managed Care – PPO

## 2020-09-05 DIAGNOSIS — J309 Allergic rhinitis, unspecified: Secondary | ICD-10-CM | POA: Diagnosis not present

## 2020-10-03 ENCOUNTER — Ambulatory Visit (INDEPENDENT_AMBULATORY_CARE_PROVIDER_SITE_OTHER): Payer: BC Managed Care – PPO

## 2020-10-03 DIAGNOSIS — J309 Allergic rhinitis, unspecified: Secondary | ICD-10-CM

## 2020-10-15 ENCOUNTER — Ambulatory Visit (INDEPENDENT_AMBULATORY_CARE_PROVIDER_SITE_OTHER): Payer: BC Managed Care – PPO | Admitting: *Deleted

## 2020-10-15 DIAGNOSIS — J309 Allergic rhinitis, unspecified: Secondary | ICD-10-CM

## 2020-10-22 ENCOUNTER — Ambulatory Visit (INDEPENDENT_AMBULATORY_CARE_PROVIDER_SITE_OTHER): Payer: BC Managed Care – PPO

## 2020-10-22 DIAGNOSIS — J309 Allergic rhinitis, unspecified: Secondary | ICD-10-CM

## 2020-10-25 ENCOUNTER — Other Ambulatory Visit: Payer: Self-pay | Admitting: Allergy and Immunology

## 2020-10-29 ENCOUNTER — Encounter: Payer: Self-pay | Admitting: Allergy and Immunology

## 2020-10-29 ENCOUNTER — Ambulatory Visit: Payer: BC Managed Care – PPO | Admitting: Allergy and Immunology

## 2020-10-29 ENCOUNTER — Other Ambulatory Visit: Payer: Self-pay

## 2020-10-29 VITALS — BP 116/82 | HR 67 | Temp 97.6°F | Wt 151.6 lb

## 2020-10-29 DIAGNOSIS — J309 Allergic rhinitis, unspecified: Secondary | ICD-10-CM

## 2020-10-29 MED ORDER — FAMOTIDINE 40 MG PO TABS: ORAL_TABLET | ORAL | 5 refills | Status: AC

## 2020-10-29 MED ORDER — QVAR REDIHALER 40 MCG/ACT IN AERB: 2.0000 | INHALATION_SPRAY | Freq: Every day | RESPIRATORY_TRACT | 5 refills | Status: DC

## 2020-10-29 MED ORDER — MOMETASONE FUROATE 0.1 % EX CREA: TOPICAL_CREAM | CUTANEOUS | 5 refills | Status: DC

## 2020-10-29 MED ORDER — LEVALBUTEROL TARTRATE 45 MCG/ACT IN AERO: 1.0000 | INHALATION_SPRAY | Freq: Four times a day (QID) | RESPIRATORY_TRACT | 3 refills | Status: DC | PRN

## 2020-10-29 MED ORDER — OMEPRAZOLE 40 MG PO CPDR: DELAYED_RELEASE_CAPSULE | ORAL | 5 refills | Status: DC

## 2020-10-29 NOTE — Patient Instructions (Addendum)
  1. Continue Qvar 40 REDIHALER 2 inhalation daily  2. Continue immunotherapy and AUVI-Q 0.3  3. If needed:   A.  Zyrtec   B.  Xopenex HFA  C.  Mometasone 0.1% Cream  4. Treat reflux:   A. Consolidate caffeine as much as possible  B. Omeprazole 40 mg - 1 tablet daily IF NEEDED  5. Increase QVAR to three inhalations three times per day during 'flare up'  6.  Return in 12 months or earlier if problem.

## 2020-10-29 NOTE — Progress Notes (Signed)
[name]   Follow-up Note  Referring Provider: Donald Prose, MD Primary Provider: Donald Prose, MD Date of Office Visit: 10/29/2020  Subjective:   Brittany Thornton (DOB: 26-Jan-1976) is a 45 y.o. female who returns to the [facility] on 10/29/2020 in re-evaluation of the following:  HPI: Brittany Thornton returns to this clinic in evaluation of asthma and allergic rhinoconjunctivitis and oral allergy syndrome and atopic dermatitis and reflux.  Her last visit to this clinic was 30 April 2020.  She has really done well with her asthma and rarely uses a short acting bronchodilator where she continues to use her Qvar on a consistent basis.  She did contract COVID around Christmas season after having received 2 COVID vaccines and apparently did develop cough and wheezing and required a systemic steroid without any long-term sequela.  Otherwise she has not required any systemic steroid or antibiotic for any type of airway issue.  She has had very little problems with her upper airway allergies and does not require a nasal steroid.  She has done very well with her oral allergy syndrome and basically can eat almost everything except bananas and watermelon.  She continues to use immunotherapy currently at every 4 weeks without any adverse effect.  Her reflux is under excellent control while consistently using omeprazole.  She has not been having any skin issues requiring a topical steroid.  This is usually an event that is associated with extensive sunlight exposure during the summer.  Allergies as of 10/29/2020      Reactions   Codeine    Other Itching, Swelling   Oral pollinosis - fruit   Oxycodone       Medication List            BORIC ACID EX 1 capsule every 7 (seven) weeks.   cetirizine 10 MG tablet Commonly known as: ZYRTEC Take 10 mg by mouth daily.   EPINEPHrine 0.3 mg/0.3 mL Soaj injection Commonly known as:  Auvi-Q Use as directed for severe allergic reaction   levalbuterol 45 MCG/ACT inhaler Commonly known as: Xopenex HFA Inhale 1-2 puffs into the lungs every 6 (six) hours as needed for wheezing.   mometasone 0.1 % cream Commonly known as: ELOCON Use 1-2 times daily as needed   multivitamin tablet Take 1 tablet by mouth daily.   omeprazole 40 MG capsule Commonly known as: PRILOSEC TAKE ONE CAPSULE BY MOUTH EVERY MORNING   Qvar RediHaler 40 MCG/ACT inhaler Generic drug: beclomethasone Inhale 2 puffs into the lungs daily.       Past Medical History:  Diagnosis Date  . Abnormal Pap smear    ASCUS  . Anemia   . Asthma   . Dizziness 2005  . Dizziness 2005  . Dyspareunia   . Elevated prolactin level 2004  . Galactorrhea 2004  . Hematuria   . History of kidney stones   . Nausea 2005  . Rectal itching   . SOB (shortness of breath) 2005  . Vaginal burning 2006    History reviewed. No pertinent surgical history.  Review of systems negative except as noted in HPI / PMHx or noted below:  Review of Systems  Constitutional: Negative.   HENT: Negative.   Eyes: Negative.   Respiratory: Negative.   Cardiovascular: Negative.   Gastrointestinal: Negative.   Genitourinary: Negative.   Musculoskeletal: Negative.   Skin: Negative.   Neurological: Negative.   Endo/Heme/Allergies: Negative.   Psychiatric/Behavioral: Negative.  Objective:   Vitals:   10/29/20 1609  BP: 116/82  Pulse: 67  Temp: 97.6 F (36.4 C)  SpO2: 100%      Weight: 151 lb 9.6 oz (68.8 kg)   Physical Exam Constitutional:      Appearance: She is not diaphoretic.  HENT:     Head: Normocephalic.     Right Ear: Tympanic membrane, ear canal and external ear normal.     Left Ear: Tympanic membrane, ear canal and external ear normal.     Nose: Nose normal. No mucosal edema or rhinorrhea.     Mouth/Throat:     Pharynx: Uvula midline. No oropharyngeal exudate.  Eyes:     Conjunctiva/sclera:  Conjunctivae normal.  Neck:     Thyroid: No thyromegaly.     Trachea: Trachea normal. No tracheal tenderness or tracheal deviation.  Cardiovascular:     Rate and Rhythm: Normal rate and regular rhythm.     Heart sounds: Normal heart sounds, S1 normal and S2 normal. No murmur heard.   Pulmonary:     Effort: No respiratory distress.     Breath sounds: Normal breath sounds. No stridor. No wheezing or rales.  Lymphadenopathy:     Head:     Right side of head: No tonsillar adenopathy.     Left side of head: No tonsillar adenopathy.     Cervical: No cervical adenopathy.  Skin:    Findings: No erythema or rash.     Nails: There is no clubbing.  Neurological:     Mental Status: She is alert.     Diagnostics: none  Assessment and Plan:   1. Allergic rhinitis, unspecified seasonality, unspecified trigger      1. Continue Qvar 40 REDIHALER 2 inhalation daily  2. Continue immunotherapy and AUVI-Q 0.3  3. If needed:   A.  Zyrtec   B.  Xopenex HFA  C.  Mometasone 0.1% Cream  4. Treat reflux:   A. Consolidate caffeine as much as possible  B. Omeprazole 40 mg - 1 tablet daily IF NEEDED  5. Increase QVAR to three inhalations three times per day during 'flare up'  6.  Return in 12 months or earlier if problem.   Brittany Thornton appears to be doing very well on her current plan and her immunotherapy has really resulted in very good improvement regarding her atopic respiratory disease and her oral allergy syndrome.  She will continue on this form of treatment and she will still continue to use Qvar on a regular basis but all of her other medications can be used as needed.  That would include the use of her omeprazole to treat her reflux disease as she has made some significant dietary manipulation and she may no longer require omeprazole on such a consistent basis.  If she does well with this plan I will see her back in this clinic in 1 year or earlier if there is a problem.   [name],  MD Allergy / Immunology [place]

## 2020-10-30 ENCOUNTER — Encounter: Payer: Self-pay | Admitting: Allergy and Immunology

## 2020-10-30 MED ORDER — OMEPRAZOLE 40 MG PO CPDR: DELAYED_RELEASE_CAPSULE | ORAL | 5 refills | Status: DC

## 2020-10-30 MED ORDER — LEVALBUTEROL TARTRATE 45 MCG/ACT IN AERO: 1.0000 | INHALATION_SPRAY | Freq: Four times a day (QID) | RESPIRATORY_TRACT | 3 refills | Status: DC | PRN

## 2020-10-30 MED ORDER — MOMETASONE FUROATE 0.1 % EX CREA: TOPICAL_CREAM | CUTANEOUS | 5 refills | Status: DC

## 2020-10-30 MED ORDER — CETIRIZINE HCL 10 MG PO TABS: 10.0000 mg | ORAL_TABLET | Freq: Every day | ORAL | 2 refills | Status: DC

## 2020-10-30 MED ORDER — QVAR REDIHALER 40 MCG/ACT IN AERB: 2.0000 | INHALATION_SPRAY | Freq: Every day | RESPIRATORY_TRACT | 5 refills | Status: DC

## 2020-11-14 ENCOUNTER — Ambulatory Visit (INDEPENDENT_AMBULATORY_CARE_PROVIDER_SITE_OTHER): Payer: BC Managed Care – PPO | Admitting: *Deleted

## 2020-11-14 DIAGNOSIS — J309 Allergic rhinitis, unspecified: Secondary | ICD-10-CM | POA: Diagnosis not present

## 2020-11-19 ENCOUNTER — Ambulatory Visit (INDEPENDENT_AMBULATORY_CARE_PROVIDER_SITE_OTHER): Payer: BC Managed Care – PPO | Admitting: *Deleted

## 2020-11-19 DIAGNOSIS — J309 Allergic rhinitis, unspecified: Secondary | ICD-10-CM | POA: Diagnosis not present

## 2020-12-03 ENCOUNTER — Ambulatory Visit (INDEPENDENT_AMBULATORY_CARE_PROVIDER_SITE_OTHER): Payer: BC Managed Care – PPO | Admitting: *Deleted

## 2020-12-03 DIAGNOSIS — J309 Allergic rhinitis, unspecified: Secondary | ICD-10-CM

## 2020-12-10 ENCOUNTER — Ambulatory Visit (INDEPENDENT_AMBULATORY_CARE_PROVIDER_SITE_OTHER): Payer: BC Managed Care – PPO | Admitting: *Deleted

## 2020-12-10 DIAGNOSIS — J309 Allergic rhinitis, unspecified: Secondary | ICD-10-CM | POA: Diagnosis not present

## 2021-01-01 ENCOUNTER — Other Ambulatory Visit: Payer: Self-pay | Admitting: Allergy and Immunology

## 2021-01-09 ENCOUNTER — Ambulatory Visit (INDEPENDENT_AMBULATORY_CARE_PROVIDER_SITE_OTHER): Payer: BC Managed Care – PPO | Admitting: *Deleted

## 2021-01-09 DIAGNOSIS — J309 Allergic rhinitis, unspecified: Secondary | ICD-10-CM | POA: Diagnosis not present

## 2021-02-13 ENCOUNTER — Ambulatory Visit (INDEPENDENT_AMBULATORY_CARE_PROVIDER_SITE_OTHER): Payer: BC Managed Care – PPO

## 2021-02-13 DIAGNOSIS — J309 Allergic rhinitis, unspecified: Secondary | ICD-10-CM

## 2021-02-20 DIAGNOSIS — J301 Allergic rhinitis due to pollen: Secondary | ICD-10-CM

## 2021-02-20 NOTE — Progress Notes (Signed)
VIALS MADE. EXP 02-20-22

## 2021-03-21 ENCOUNTER — Telehealth: Payer: Self-pay

## 2021-03-21 NOTE — Telephone Encounter (Signed)
Patient called stating she has not been able to come in to get her allergy injections due to health issues. Once she feels better she will return to get her shots. Patient wanted this to be documented in her records.

## 2021-04-03 ENCOUNTER — Ambulatory Visit (INDEPENDENT_AMBULATORY_CARE_PROVIDER_SITE_OTHER): Payer: BC Managed Care – PPO | Admitting: *Deleted

## 2021-04-03 DIAGNOSIS — J309 Allergic rhinitis, unspecified: Secondary | ICD-10-CM

## 2021-05-06 ENCOUNTER — Ambulatory Visit (INDEPENDENT_AMBULATORY_CARE_PROVIDER_SITE_OTHER): Payer: BC Managed Care – PPO | Admitting: *Deleted

## 2021-05-06 DIAGNOSIS — J309 Allergic rhinitis, unspecified: Secondary | ICD-10-CM

## 2021-06-10 ENCOUNTER — Ambulatory Visit (INDEPENDENT_AMBULATORY_CARE_PROVIDER_SITE_OTHER): Payer: BC Managed Care – PPO | Admitting: *Deleted

## 2021-06-10 DIAGNOSIS — J309 Allergic rhinitis, unspecified: Secondary | ICD-10-CM | POA: Diagnosis not present

## 2021-06-17 ENCOUNTER — Ambulatory Visit (INDEPENDENT_AMBULATORY_CARE_PROVIDER_SITE_OTHER): Payer: BC Managed Care – PPO

## 2021-06-17 DIAGNOSIS — J309 Allergic rhinitis, unspecified: Secondary | ICD-10-CM

## 2021-06-22 IMAGING — DX DG CHEST 2V
2 series · 2 of 2 positions shown · non-contrast
Comparison: None.

CLINICAL DATA: Chest pain and shortness of breath.  Racing heart.

EXAM:
CHEST - 2 VIEW

[chest pa]
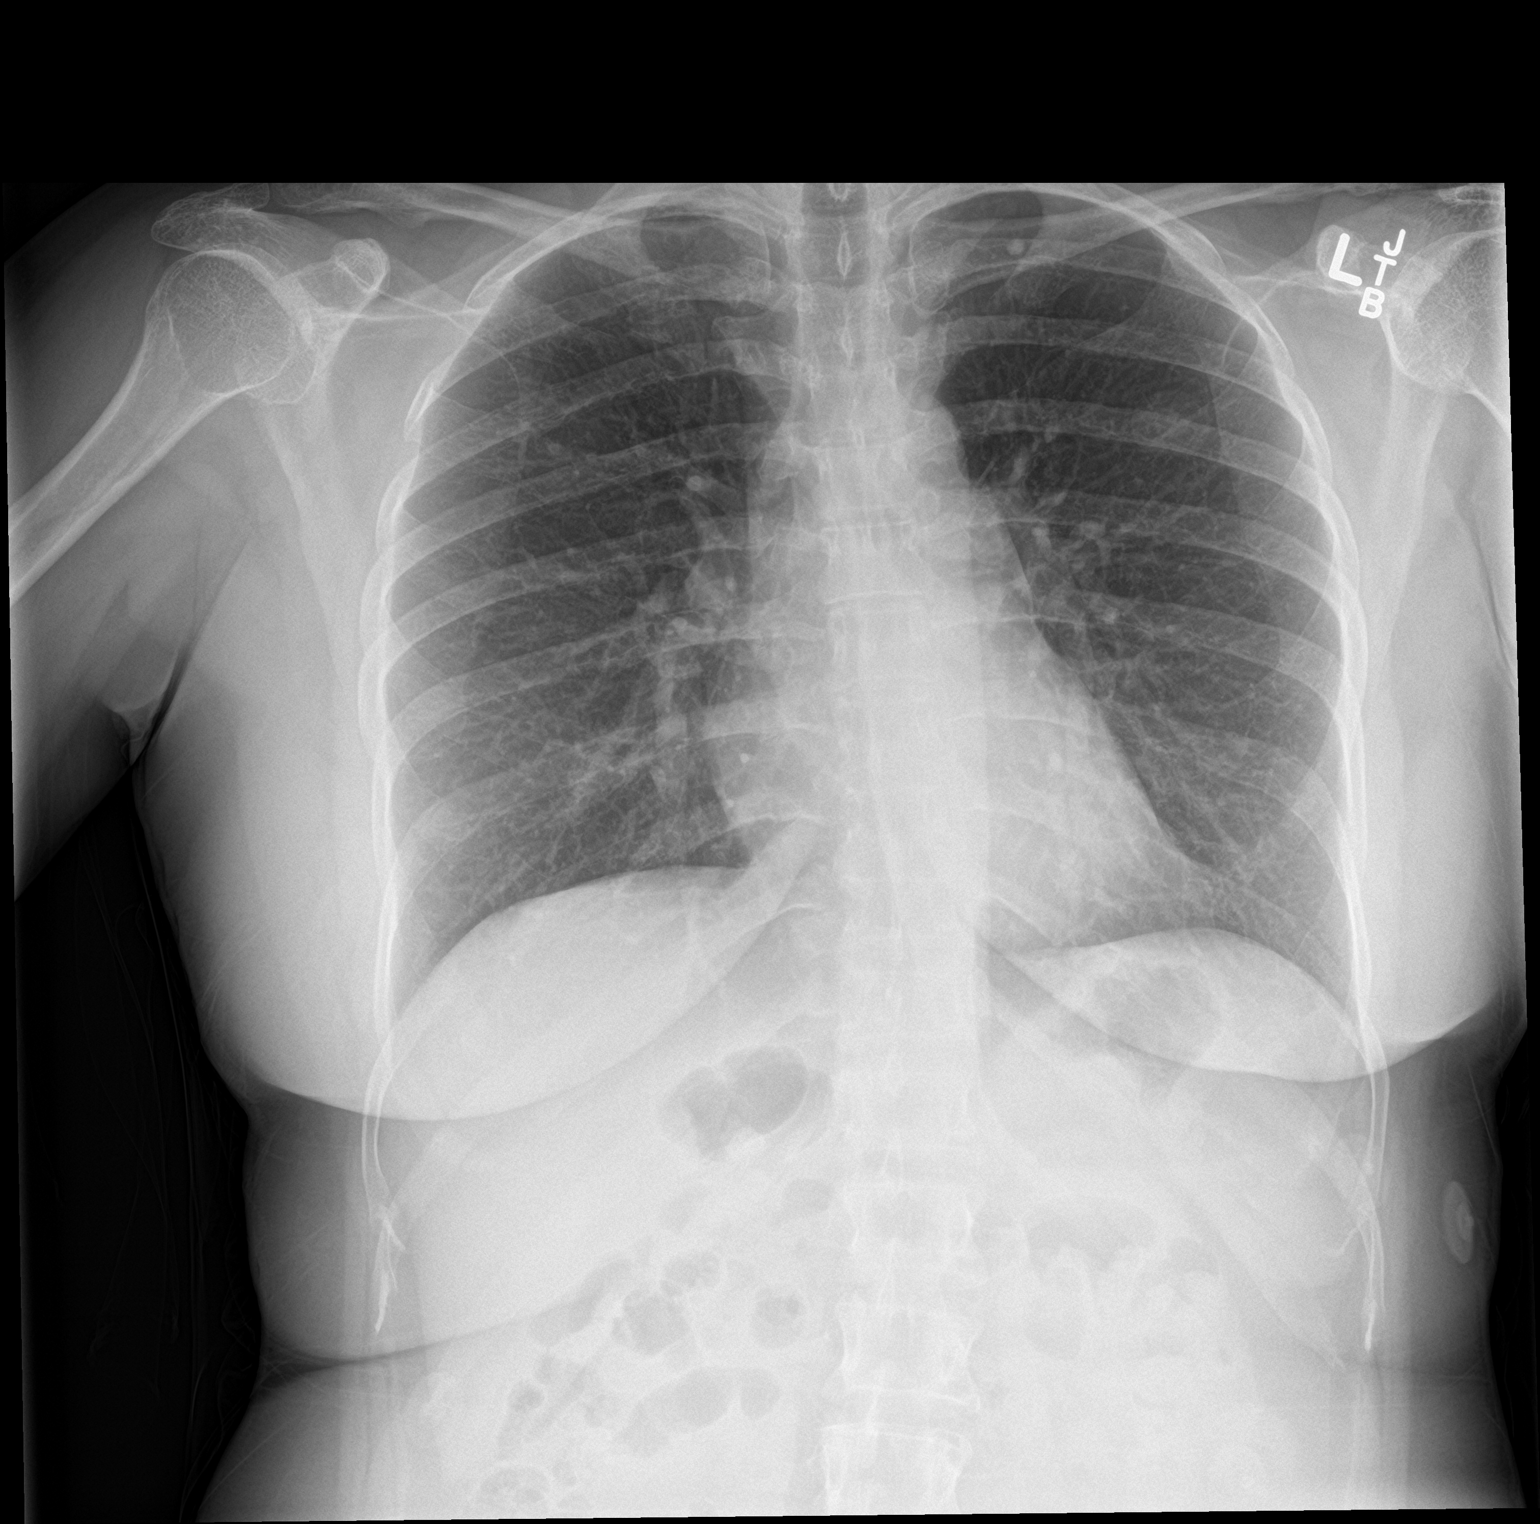

[chest lat]
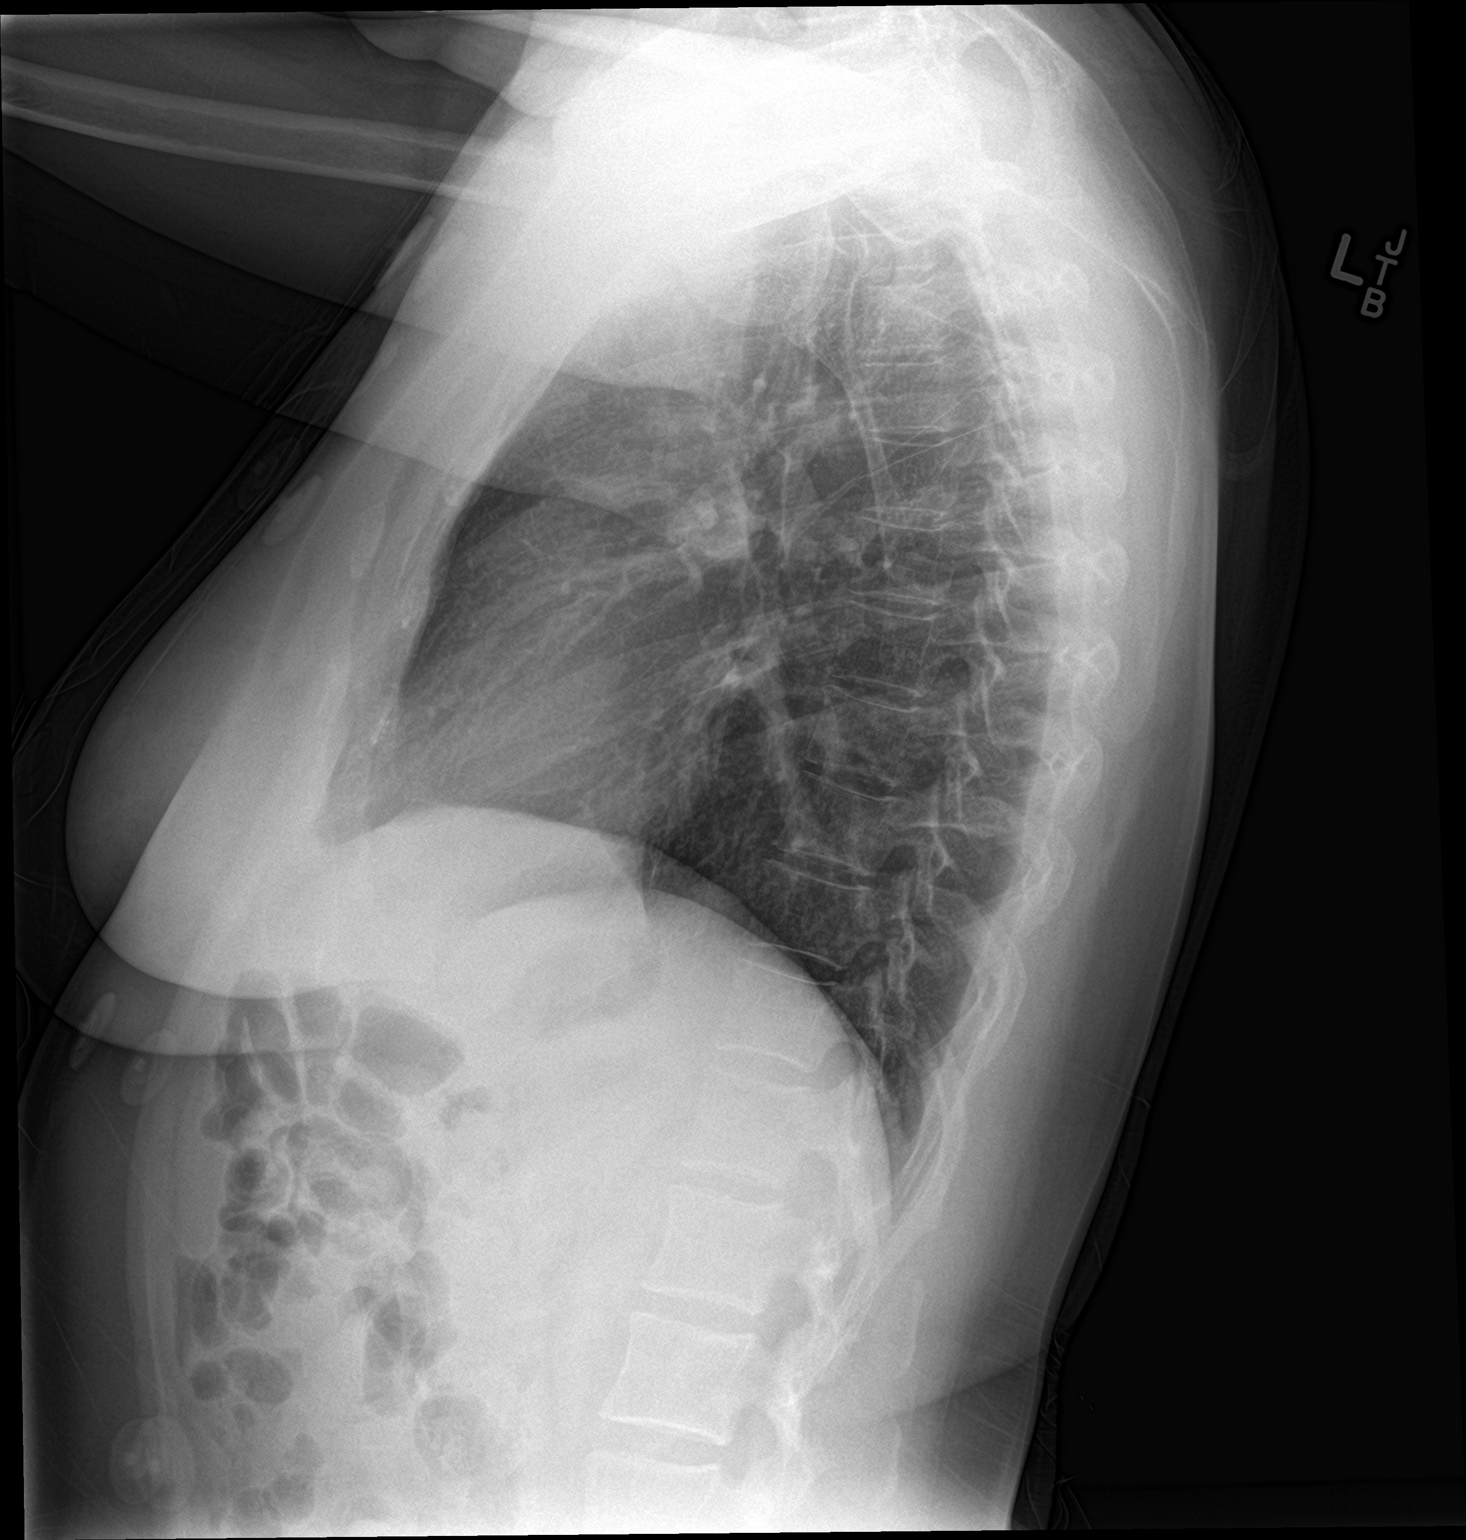

[2 of 2 positions shown; findings below may reference images not displayed]

FINDINGS: A calcified granuloma is seen in the left apex. The heart, hila,
mediastinum, lungs, and pleura are unremarkable. No edema. No focal
infiltrate.
IMPRESSION: No active cardiopulmonary disease.

## 2021-07-01 ENCOUNTER — Ambulatory Visit (INDEPENDENT_AMBULATORY_CARE_PROVIDER_SITE_OTHER): Payer: BC Managed Care – PPO | Admitting: *Deleted

## 2021-07-01 DIAGNOSIS — J309 Allergic rhinitis, unspecified: Secondary | ICD-10-CM | POA: Diagnosis not present

## 2021-07-15 ENCOUNTER — Ambulatory Visit (INDEPENDENT_AMBULATORY_CARE_PROVIDER_SITE_OTHER): Payer: BC Managed Care – PPO | Admitting: *Deleted

## 2021-07-15 DIAGNOSIS — J309 Allergic rhinitis, unspecified: Secondary | ICD-10-CM

## 2021-07-25 ENCOUNTER — Ambulatory Visit (INDEPENDENT_AMBULATORY_CARE_PROVIDER_SITE_OTHER): Payer: BC Managed Care – PPO

## 2021-07-25 DIAGNOSIS — J309 Allergic rhinitis, unspecified: Secondary | ICD-10-CM

## 2021-07-31 ENCOUNTER — Ambulatory Visit (INDEPENDENT_AMBULATORY_CARE_PROVIDER_SITE_OTHER): Payer: BC Managed Care – PPO

## 2021-07-31 DIAGNOSIS — J309 Allergic rhinitis, unspecified: Secondary | ICD-10-CM | POA: Diagnosis not present

## 2021-08-05 ENCOUNTER — Ambulatory Visit (INDEPENDENT_AMBULATORY_CARE_PROVIDER_SITE_OTHER): Payer: BC Managed Care – PPO

## 2021-08-05 DIAGNOSIS — J309 Allergic rhinitis, unspecified: Secondary | ICD-10-CM

## 2021-08-14 ENCOUNTER — Ambulatory Visit (INDEPENDENT_AMBULATORY_CARE_PROVIDER_SITE_OTHER): Payer: BC Managed Care – PPO

## 2021-08-14 DIAGNOSIS — J309 Allergic rhinitis, unspecified: Secondary | ICD-10-CM

## 2021-09-02 ENCOUNTER — Other Ambulatory Visit: Payer: Self-pay | Admitting: Allergy and Immunology

## 2021-09-11 ENCOUNTER — Ambulatory Visit (INDEPENDENT_AMBULATORY_CARE_PROVIDER_SITE_OTHER): Payer: BC Managed Care – PPO

## 2021-09-11 DIAGNOSIS — J309 Allergic rhinitis, unspecified: Secondary | ICD-10-CM | POA: Diagnosis not present

## 2021-10-09 ENCOUNTER — Ambulatory Visit (INDEPENDENT_AMBULATORY_CARE_PROVIDER_SITE_OTHER): Payer: BC Managed Care – PPO

## 2021-10-09 DIAGNOSIS — J309 Allergic rhinitis, unspecified: Secondary | ICD-10-CM | POA: Diagnosis not present

## 2021-10-14 DIAGNOSIS — J3089 Other allergic rhinitis: Secondary | ICD-10-CM | POA: Diagnosis not present

## 2021-10-14 NOTE — Progress Notes (Signed)
VIALS EXP 10-15-22 ?

## 2021-11-03 ENCOUNTER — Other Ambulatory Visit: Payer: Self-pay | Admitting: Obstetrics & Gynecology

## 2021-11-04 ENCOUNTER — Ambulatory Visit: Payer: BC Managed Care – PPO | Admitting: Allergy and Immunology

## 2021-11-04 ENCOUNTER — Encounter: Payer: Self-pay | Admitting: Allergy and Immunology

## 2021-11-04 VITALS — BP 96/62 | HR 80 | Temp 98.0°F | Resp 16 | Ht 62.0 in | Wt 150.8 lb

## 2021-11-04 DIAGNOSIS — L989 Disorder of the skin and subcutaneous tissue, unspecified: Secondary | ICD-10-CM

## 2021-11-04 DIAGNOSIS — J3089 Other allergic rhinitis: Secondary | ICD-10-CM

## 2021-11-04 DIAGNOSIS — K219 Gastro-esophageal reflux disease without esophagitis: Secondary | ICD-10-CM

## 2021-11-04 DIAGNOSIS — J309 Allergic rhinitis, unspecified: Secondary | ICD-10-CM | POA: Diagnosis not present

## 2021-11-04 DIAGNOSIS — T781XXD Other adverse food reactions, not elsewhere classified, subsequent encounter: Secondary | ICD-10-CM

## 2021-11-04 DIAGNOSIS — J4530 Mild persistent asthma, uncomplicated: Secondary | ICD-10-CM | POA: Diagnosis not present

## 2021-11-04 DIAGNOSIS — G4720 Circadian rhythm sleep disorder, unspecified type: Secondary | ICD-10-CM

## 2021-11-04 MED ORDER — CETIRIZINE HCL 10 MG PO TABS
10.0000 mg | ORAL_TABLET | Freq: Two times a day (BID) | ORAL | 11 refills | Status: DC | PRN
Start: 2021-11-04 — End: 2023-11-30

## 2021-11-04 MED ORDER — LEVALBUTEROL TARTRATE 45 MCG/ACT IN AERO: 1.0000 | INHALATION_SPRAY | Freq: Four times a day (QID) | RESPIRATORY_TRACT | 2 refills | Status: AC | PRN

## 2021-11-04 MED ORDER — OMEPRAZOLE 40 MG PO CPDR: 40.0000 mg | DELAYED_RELEASE_CAPSULE | Freq: Every day | ORAL | 11 refills | Status: DC

## 2021-11-04 MED ORDER — QVAR REDIHALER 40 MCG/ACT IN AERB: 2.0000 | INHALATION_SPRAY | Freq: Every day | RESPIRATORY_TRACT | 5 refills | Status: DC

## 2021-11-04 MED ORDER — CYPROHEPTADINE HCL 4 MG PO TABS: 4.0000 mg | ORAL_TABLET | Freq: Every evening | ORAL | 11 refills | Status: DC

## 2021-11-04 MED ORDER — MOMETASONE FUROATE 0.1 % EX CREA: TOPICAL_CREAM | Freq: Two times a day (BID) | CUTANEOUS | 11 refills | Status: DC | PRN

## 2021-11-04 NOTE — Progress Notes (Signed)
? ?[facility] ? ? ?Follow-up Note ? ?Referring Provider: Donald Prose, MD ?Primary Provider: Donald Prose, MD ?Date of Office Visit: 11/04/2021 ? ?Subjective:  ? ?Brittany Thornton (DOB: 1975-08-26) is a 46 y.o. female who returns to the [facility] on 11/04/2021 in re-evaluation of the following: ? ?HPI: Anj returns to this clinic in evaluation of asthma, allergic rhinoconjunctivitis, oral allergy syndrome, atopic dermatitis, and reflux.  Her last visit to this clinic was 29 October 2020. ? ?She has really done well with her airway issue and no longer uses any Qvar and has no need to use short acting bronchodilator and can exercise without any difficulty and has had very little problems with her nose while continuing to receive immunotherapy currently at every 4 weeks and also continuing on Zyrtec. ? ?Her oral allergy syndrome is still somewhat active.  If she eats mushrooms and bananas and watermelon and cantaloupe and cherries and pineapple and sometimes peanuts she will get itching of her mouth so she tends to stay away from those foods. ? ?Her reflux has been under excellent control.  She does not use any omeprazole at this point. ? ?Her skin has really done well and she has had no need to use any topical mometasone.  It is usually with sunlight exposure that she develops an inflammatory dermatosis and she is reserving the use of her topical mometasone for the summer. ? ?She relates a history of having some problems with sleep recently.  It takes her an hour to initiate sleep.  And then she has fractured sleep.  She is a very light sleeper and when she wakes up it is difficult for her to go back to sleep.  She does not wake up gasping or choking.  But her lack of sleep is making her little bit sleepy during the daytime.  There may be some snoring but it is really her husband who snores the most with inside the household. ? ?Allergies as of 11/04/2021    ? ?   Reactions  ? Codeine   ? Other Itching, Swelling  ? Oral pollinosis - fruit  ? Oxycodone   ? ?  ? ?  ?Medication List  ? ? ?Auvi-Q 0.3 mg/0.3 mL Soaj injection ?Generic drug: EPINEPHrine ?INJECT AS NEEDED FOR SEVERE ALLERGIC REACTION INCLUDING ANAPHYLAXIS AS DIRECTED ?  ?BORIC ACID EX ?1 capsule every 7 (seven) weeks. ?  ?cetirizine 10 MG tablet ?Commonly known as: ZYRTEC ?Take 1 tablet (10 mg total) by mouth daily. ?  ?Cholecalciferol 125 MCG (5000 UT) Tabs ?Take 1 tablet by mouth daily. ?  ?famotidine 40 MG tablet ?Commonly known as: PEPCID ?Take 1 tablet daily in the afternoon ?  ?levalbuterol 45 MCG/ACT inhaler ?Commonly known as: Xopenex HFA ?Inhale 1-2 puffs into the lungs every 6 (six) hours as needed for wheezing. ?  ?mometasone 0.1 % cream ?Commonly known as: ELOCON ?APPLY TO AFFECTED AREA(S) 1 TO 2 TIMES PER DAY AS NEEDED ?  ?multivitamin tablet ?Take 1 tablet by mouth daily. ?  ?Qvar RediHaler 40 MCG/ACT inhaler ?Generic drug: beclomethasone ?Inhale 2 puffs into the lungs daily. ?  ? ?Past Medical History:  ?Diagnosis Date  ? Abnormal Pap smear   ? ASCUS  ? Anemia   ? Asthma   ? Dizziness 2005  ? Dizziness 2005  ? Dyspareunia   ? Elevated prolactin level 2004  ? Galactorrhea 2004  ? Hematuria   ? History of kidney  stones   ? Nausea 2005  ? Rectal itching   ? SOB (shortness of breath) 2005  ? Vaginal burning 2006  ? ? ?History reviewed. No pertinent surgical history. ? ?Review of systems negative except as noted in HPI / PMHx or noted below: ? ?Review of Systems  ?Constitutional: Negative.   ?HENT: Negative.    ?Eyes: Negative.   ?Respiratory: Negative.    ?Cardiovascular: Negative.   ?Gastrointestinal: Negative.   ?Genitourinary: Negative.   ?Musculoskeletal: Negative.   ?Skin: Negative.   ?Neurological: Negative.   ?Endo/Heme/Allergies: Negative.   ?Psychiatric/Behavioral: Negative.    ? ? ?Objective:  ? ?Vitals:  ? 11/05/21 0939  ?BP: 96/62  ?Pulse: 80  ?Resp: 16  ?Temp: 98 ?F (36.7 ?C)  ?SpO2:  97%  ? ?Height: '5\' 2"'$  (157.5 cm)  ?Weight: 150 lb 12.8 oz (68.4 kg)  ? ?Physical Exam ?Constitutional:   ?   Appearance: She is not diaphoretic.  ?HENT:  ?   Head: Normocephalic.  ?   Right Ear: Tympanic membrane, ear canal and external ear normal.  ?   Left Ear: Tympanic membrane, ear canal and external ear normal.  ?   Nose: Nose normal. No mucosal edema or rhinorrhea.  ?   Mouth/Throat:  ?   Pharynx: Uvula midline. No oropharyngeal exudate.  ?Eyes:  ?   Conjunctiva/sclera: Conjunctivae normal.  ?Neck:  ?   Thyroid: No thyromegaly.  ?   Trachea: Trachea normal. No tracheal tenderness or tracheal deviation.  ?Cardiovascular:  ?   Rate and Rhythm: Normal rate and regular rhythm.  ?   Heart sounds: Normal heart sounds, S1 normal and S2 normal. No murmur heard. ?Pulmonary:  ?   Effort: No respiratory distress.  ?   Breath sounds: Normal breath sounds. No stridor. No wheezing or rales.  ?Lymphadenopathy:  ?   Head:  ?   Right side of head: No tonsillar adenopathy.  ?   Left side of head: No tonsillar adenopathy.  ?   Cervical: No cervical adenopathy.  ?Skin: ?   Findings: No erythema or rash.  ?   Nails: There is no clubbing.  ?Neurological:  ?   Mental Status: She is alert.  ? ? ?Diagnostics:  ?  ?Spirometry was performed and demonstrated an FEV1 of 1.72 at 67 % of predicted. ? ?Assessment and Plan:  ? ?1. Asthma, well controlled, mild persistent   ?2. Other allergic rhinitis   ?3. Pollen-food allergy, subsequent encounter   ?4. Inflammatory dermatosis   ?5. Gastroesophageal reflux disease, unspecified whether esophagitis present   ?6. Dysfunction of sleep stage or arousal   ?7. Allergic rhinitis, unspecified seasonality, unspecified trigger   ? ?  ?1. Continue immunotherapy and AUVI-Q 0.3 ? ?2. If needed: ? ? A.  Zyrtec  ? B.  Xopenex HFA ? C.  Mometasone 0.1% Cream ? D. Omeprazole 40 mg - 1 tablet 1 time per day ? ?3. "Action Plan" for asthma flare up: ? ? A. QVAR 40 -  three inhalations three times per day   ? B.  Albuterol if needed ? ?4. Treat sleep dysfunction: ? ?A. Periactin '4mg'$  - 1/2 - 1 tablet at bedtime ? B. Contact clinic with update in a few weeks ? ?5.  Return in 12 months or earlier if problem.  ? ?Anj is really doing very well regarding her atopic respiratory disease on her current plan of action which includes immunotherapy.  She has a selection of agents to utilize should they  be required and she has a "action plan" to initiate should she develop an asthma flare in the future.  She will continue to use immunotherapy at every 4 weeks at this point.  Her reflux also has improved tremendously and she can use omeprazole as needed at this point.  She has some type of sleep dysfunction with initiation insomnia and also fractured sleep and we will treat her with Periactin and I have asked her to contact me in a few weeks noting her response to this approach.  Further evaluation and treatment will be based upon her response. ?  ?[name], MD ?Allergy / Immunology ?[place] ?

## 2021-11-04 NOTE — Patient Instructions (Addendum)
?  1. Continue immunotherapy and AUVI-Q 0.3 ? ?2. If needed: ? ? A.  Zyrtec  ? B.  Xopenex HFA ? C.  Mometasone 0.1% Cream ? D. Omeprazole 40 mg - 1 tablet 1 time per day ? ?3. "Action Plan" for asthma flare up: ? ? A. QVAR 40 -  three inhalations three times per day   ? B. Albuterol if needed ? ?4. Treat sleep dysfunction: ? ? A. Periactin '4mg'$  - 1/2 - 1 tablet at bedtime ? B. Contact clinic with update in a few weeks ? ?5.  Return in 12 months or earlier if problem.  ? ?  ?  ? ?  ?

## 2021-11-05 ENCOUNTER — Encounter: Payer: Self-pay | Admitting: Allergy and Immunology

## 2021-11-10 ENCOUNTER — Encounter: Payer: Self-pay | Admitting: Allergy and Immunology

## 2021-11-14 ENCOUNTER — Telehealth: Payer: Self-pay | Admitting: Allergy and Immunology

## 2021-11-14 MED ORDER — FLUTICASONE PROPIONATE HFA 110 MCG/ACT IN AERO: 2.0000 | INHALATION_SPRAY | Freq: Three times a day (TID) | RESPIRATORY_TRACT | 5 refills | Status: AC | PRN

## 2021-11-14 NOTE — Telephone Encounter (Signed)
Patient states Qvar is not being covered by her insurance and will be costing her $274. She is wondering if there is a cheaper alternative that can be sent in. I informed patient that Dr. Neldon Mc is out of office until Monday and she requested another provider to give recommendation.  ? ?Best pharmacy- ?[facility] on [address]  ?

## 2021-11-14 NOTE — Telephone Encounter (Signed)
Can you please send in Flovent 110. For asthma flare, begin Flovent 110-3 puffs three times a day for 2 weeks or until cough and wheeze free. If this is not covered can you please find out what they would cover? Thank you

## 2021-11-14 NOTE — Telephone Encounter (Signed)
Called and spoke to patient and informed her of Anne's note. Patient verbally expressed understanding and agreed to get medications.  ?

## 2021-11-18 ENCOUNTER — Telehealth: Payer: Self-pay | Admitting: Allergy and Immunology

## 2021-11-18 NOTE — Telephone Encounter (Signed)
Periactin 4 mg has been helping and she was told to try it out for  2 week and call back for further direction. Her sleeping has improved she take 1/2 the dose during the week and one full tablet on weekend days. Please advise if she is to continue or stop this medication.  ?

## 2021-11-18 NOTE — Telephone Encounter (Signed)
Patient was calling to let dr Neldon Mc know that the Periactin 4 mg. Was working. Did not know if he wanted her to stay on it?. [phone] ?

## 2021-11-19 NOTE — Telephone Encounter (Signed)
Patient verbalized understanding and will regroup back in 6 months  ?

## 2021-12-11 ENCOUNTER — Ambulatory Visit (INDEPENDENT_AMBULATORY_CARE_PROVIDER_SITE_OTHER): Payer: BC Managed Care – PPO

## 2021-12-11 DIAGNOSIS — J309 Allergic rhinitis, unspecified: Secondary | ICD-10-CM

## 2022-01-14 ENCOUNTER — Telehealth: Payer: Self-pay

## 2022-01-14 ENCOUNTER — Ambulatory Visit: Payer: Self-pay

## 2022-01-14 NOTE — Telephone Encounter (Signed)
Patient called because she is scheduled for surgery on 01/26/22 and will not be able to continue allergy shots until the beginning of August. Her last injection was on 12/11/21 and will be starting a new vial on her next visit.

## 2022-01-15 ENCOUNTER — Ambulatory Visit (INDEPENDENT_AMBULATORY_CARE_PROVIDER_SITE_OTHER): Payer: BC Managed Care – PPO

## 2022-01-15 DIAGNOSIS — J309 Allergic rhinitis, unspecified: Secondary | ICD-10-CM

## 2022-01-20 NOTE — Pre-Procedure Instructions (Signed)
Surgical Instructions    Your procedure is scheduled on January 26, 2022.  Report to [facility] Main Entrance "A" at 10:50 A.M., then check in with the Admitting office.  Call this number if you have problems the morning of surgery:  [phone]   If you have any questions prior to your surgery date call [phone]: Open Monday-Friday 8am-4pm    Remember:  Do not eat or drink after midnight the night before your surgery     Take these medicines the morning of surgery with A SIP OF WATER:  beclomethasone (QVAR REDIHALER)  famotidine (PEPCID)   levothyroxine (SYNTHROID)  omeprazole (PRILOSEC)   Take these medicines the morning of surgery AS NEEDED:  AUVI-Q   cetirizine (ZYRTEC)   fluticasone (FLOVENT HFA) - bring inhaler with you to hospital  levalbuterol [facility] HFA) - bring inhaler with you to hospital  As of today, STOP taking any Aspirin (unless otherwise instructed by your surgeon) Aleve, Naproxen, Ibuprofen, Motrin, Advil, Goody's, BC's, all herbal medications, fish oil, and all vitamins.                     Do NOT Smoke (Tobacco/Vaping) for 24 hours prior to your procedure.  If you use a CPAP at night, you may bring your mask/headgear for your overnight stay.   Contacts, glasses, piercing's, hearing aid's, dentures or partials may not be worn into surgery, please bring cases for these belongings.    For patients admitted to the hospital, discharge time will be determined by your treatment team.   Patients discharged the day of surgery will not be allowed to drive home, and someone needs to stay with them for 24 hours.  SURGICAL WAITING ROOM VISITATION Patients having surgery or a procedure may have two support people in the waiting room. These visitors may be switched out with other visitors if needed. Children under the age of 66 must have an adult accompany them who is not the patient. If the patient needs to stay at the hospital during part of their recovery, the  visitor guidelines for inpatient rooms apply.  Please refer to the [facility] website for the visitor guidelines for Inpatients (after your surgery is over and you are in a regular room).    Special instructions:   [facility]- Preparing For Surgery  Before surgery, you can play an important role. Because skin is not sterile, your skin needs to be as free of germs as possible. You can reduce the number of germs on your skin by washing with CHG (chlorahexidine gluconate) Soap before surgery.  CHG is an antiseptic cleaner which kills germs and bonds with the skin to continue killing germs even after washing.    Oral Hygiene is also important to reduce your risk of infection.  Remember - BRUSH YOUR TEETH THE MORNING OF SURGERY WITH YOUR REGULAR TOOTHPASTE  Please do not use if you have an allergy to CHG or antibacterial soaps. If your skin becomes reddened/irritated stop using the CHG.  Do not shave (including legs and underarms) for at least 48 hours prior to first CHG shower. It is OK to shave your face.  Please follow these instructions carefully.   Shower the NIGHT BEFORE SURGERY and the MORNING OF SURGERY  If you chose to wash your hair, wash your hair first as usual with your normal shampoo.  After you shampoo, rinse your hair and body thoroughly to remove the shampoo.  Use CHG Soap as you would any other liquid  soap. You can apply CHG directly to the skin and wash gently with a scrungie or a clean washcloth.   Apply the CHG Soap to your body ONLY FROM THE NECK DOWN.  Do not use on open wounds or open sores. Avoid contact with your eyes, ears, mouth and genitals (private parts). Wash Face and genitals (private parts)  with your normal soap.   Wash thoroughly, paying special attention to the area where your surgery will be performed.  Thoroughly rinse your body with warm water from the neck down.  DO NOT shower/wash with your normal soap after using and rinsing off the CHG  Soap.  Pat yourself dry with a CLEAN TOWEL.  Wear CLEAN PAJAMAS to bed the night before surgery  Place CLEAN SHEETS on your bed the night before your surgery  DO NOT SLEEP WITH PETS.   Day of Surgery: Take a shower with CHG soap. Do not wear jewelry or makeup Do not wear lotions, powders, perfumes/colognes, or deodorant. Do not shave 48 hours prior to surgery.  Men may shave face and neck. Do not bring valuables to the hospital.  [facility] is not responsible for any belongings or valuables. Do not wear nail polish, gel polish, artificial nails, or any other type of covering on natural nails (fingers and toes) If you have artificial nails or gel coating that need to be removed by a nail salon, please have this removed prior to surgery. Artificial nails or gel coating may interfere with anesthesia's ability to adequately monitor your vital signs. Wear Clean/Comfortable clothing the morning of surgery Remember to brush your teeth WITH YOUR REGULAR TOOTHPASTE.   Please read over the following fact sheets that you were given.    If you received a COVID test during your pre-op visit  it is requested that you wear a mask when out in public, stay away from anyone that may not be feeling well and notify your surgeon if you develop symptoms. If you have been in contact with anyone that has tested positive in the last 10 days please notify you surgeon.

## 2022-01-21 ENCOUNTER — Encounter (HOSPITAL_COMMUNITY)
Admission: RE | Admit: 2022-01-21 | Discharge: 2022-01-21 | Disposition: A | Discharge status: Home / Self Care | Payer: BC Managed Care – PPO | Source: Ambulatory Visit | Attending: Obstetrics & Gynecology | Admitting: Obstetrics & Gynecology

## 2022-01-21 ENCOUNTER — Encounter (HOSPITAL_COMMUNITY): Payer: Self-pay

## 2022-01-21 ENCOUNTER — Other Ambulatory Visit: Payer: Self-pay

## 2022-01-21 DIAGNOSIS — Z01812 Encounter for preprocedural laboratory examination: Secondary | ICD-10-CM | POA: Diagnosis not present

## 2022-01-21 LAB — CBC
HCT: 38.1 % (ref 36.0–46.0)
Hemoglobin: 11.8 g/dL — ABNORMAL LOW (ref 12.0–15.0)
MCH: 25.9 pg — ABNORMAL LOW (ref 26.0–34.0)
MCHC: 31 g/dL (ref 30.0–36.0)
MCV: 83.7 fL (ref 80.0–100.0)
Platelets: 374 10*3/uL (ref 150–400)
RBC: 4.55 MIL/uL (ref 3.87–5.11)
RDW: 14.5 % (ref 11.5–15.5)
WBC: 12.4 10*3/uL — ABNORMAL HIGH (ref 4.0–10.5)
nRBC: 0 % (ref 0.0–0.2)

## 2022-01-21 LAB — TYPE AND SCREEN
ABO/RH(D): O POS
Antibody Screen: NEGATIVE

## 2022-01-21 NOTE — Progress Notes (Signed)
PCP - Dr. Donald Prose Cardiologist - Denies   Chest x-ray - NI EKG - NI 12/29/19 Stress Test - Denies ECHO - Denies Cardiac Cath - Denies  Sleep Study - Denies  DM - Denies   Anesthesia review:  No  Patient denies shortness of breath, fever, cough and chest pain at PAT appointment   All instructions explained to the patient, with a verbal understanding of the material. Patient agrees to go over the instructions while at home for a better understanding. The opportunity to ask questions was provided.

## 2022-01-22 NOTE — Telephone Encounter (Signed)
Patient called to inform the injection room nurses that her doctor has asked that she hold off on getting her allergy injections this week as it is too close to her surgery.

## 2022-01-25 NOTE — Anesthesia Preprocedure Evaluation (Signed)
Anesthesia Evaluation  Patient identified by MRN, date of birth, ID band Patient awake    Reviewed: Allergy & Precautions, H&P , NPO status , Patient's Chart, lab work & pertinent test results  Airway Mallampati: III  TM Distance: >3 FB Neck ROM: Full    Dental no notable dental hx. (+) Teeth Intact, Dental Advisory Given   Pulmonary asthma ,    Pulmonary exam normal breath sounds clear to auscultation       Cardiovascular negative cardio ROS   Rhythm:Regular Rate:Normal     Neuro/Psych negative neurological ROS  negative psych ROS   GI/Hepatic Neg liver ROS, GERD  ,  Endo/Other  Hypothyroidism   Renal/GU negative Renal ROS  negative genitourinary   Musculoskeletal negative musculoskeletal ROS (+)   Abdominal   Peds  Hematology  (+) Blood dyscrasia (Hgb 11.8), anemia ,   Anesthesia Other Findings   Reproductive/Obstetrics negative OB ROS                            Anesthesia Physical Anesthesia Plan  ASA: 2  Anesthesia Plan: General   Post-op Pain Management: Tylenol PO (pre-op)* and Ketamine IV*   Induction: Intravenous  PONV Risk Score and Plan: 3 and Midazolam, Dexamethasone and Ondansetron  Airway Management Planned: Oral ETT  Additional Equipment:   Intra-op Plan:   Post-operative Plan: Extubation in OR  Informed Consent: I have reviewed the patients History and Physical, chart, labs and discussed the procedure including the risks, benefits and alternatives for the proposed anesthesia with the patient or authorized representative who has indicated his/her understanding and acceptance.     Dental advisory given  Plan Discussed with: CRNA  Anesthesia Plan Comments:         Anesthesia Quick Evaluation

## 2022-01-26 ENCOUNTER — Encounter (HOSPITAL_COMMUNITY)
Admission: RE | Disposition: A | Discharge status: Home / Self Care | Payer: Self-pay | Source: Home / Self Care | Attending: Obstetrics & Gynecology

## 2022-01-26 ENCOUNTER — Ambulatory Visit (HOSPITAL_COMMUNITY): Payer: BC Managed Care – PPO | Admitting: Anesthesiology

## 2022-01-26 ENCOUNTER — Other Ambulatory Visit: Payer: Self-pay

## 2022-01-26 ENCOUNTER — Observation Stay (HOSPITAL_COMMUNITY)
Admission: RE | Admit: 2022-01-26 | Discharge: 2022-01-27 | Disposition: A | Discharge status: Home / Self Care | Payer: BC Managed Care – PPO | Attending: Obstetrics & Gynecology | Admitting: Obstetrics & Gynecology

## 2022-01-26 DIAGNOSIS — J45909 Unspecified asthma, uncomplicated: Secondary | ICD-10-CM | POA: Diagnosis not present

## 2022-01-26 DIAGNOSIS — N8003 Adenomyosis of the uterus: Secondary | ICD-10-CM | POA: Insufficient documentation

## 2022-01-26 DIAGNOSIS — Z90710 Acquired absence of both cervix and uterus: Secondary | ICD-10-CM | POA: Diagnosis present

## 2022-01-26 DIAGNOSIS — Z91040 Latex allergy status: Secondary | ICD-10-CM | POA: Diagnosis not present

## 2022-01-26 DIAGNOSIS — D259 Leiomyoma of uterus, unspecified: Principal | ICD-10-CM | POA: Insufficient documentation

## 2022-01-26 DIAGNOSIS — N939 Abnormal uterine and vaginal bleeding, unspecified: Secondary | ICD-10-CM | POA: Diagnosis present

## 2022-01-26 HISTORY — PX: TOTAL LAPAROSCOPIC HYSTERECTOMY WITH SALPINGECTOMY: SHX6742

## 2022-01-26 HISTORY — PX: CYSTOSCOPY: SHX5120

## 2022-01-26 LAB — POCT PREGNANCY, URINE: Preg Test, Ur: NEGATIVE

## 2022-01-26 LAB — ABO/RH: ABO/RH(D): O POS

## 2022-01-26 SURGERY — HYSTERECTOMY, TOTAL, LAPAROSCOPIC, WITH SALPINGECTOMY
Anesthesia: General | Site: Bladder

## 2022-01-26 MED ORDER — CHLORHEXIDINE GLUCONATE 0.12 % MT SOLN
15.0000 mL | Freq: Once | OROMUCOSAL | Status: AC
  Filled 2022-01-26: qty 15

## 2022-01-26 MED ORDER — FENTANYL CITRATE (PF) 250 MCG/5ML IJ SOLN
INTRAMUSCULAR | Status: AC
  Filled 2022-01-26: qty 5

## 2022-01-26 MED ORDER — HEMOSTATIC AGENTS (NO CHARGE) OPTIME
TOPICAL | Status: DC | PRN
  Administered 2022-01-26: 1 via TOPICAL

## 2022-01-26 MED ORDER — SENNA 8.6 MG PO TABS
1.0000 | ORAL_TABLET | Freq: Two times a day (BID) | ORAL | Status: DC
  Administered 2022-01-26 – 2022-01-27 (×2): 8.6 mg via ORAL
  Filled 2022-01-26 (×2): qty 1

## 2022-01-26 MED ORDER — ACETAMINOPHEN 325 MG PO TABS: 650.0000 mg | ORAL_TABLET | ORAL | Status: DC | PRN

## 2022-01-26 MED ORDER — TEMAZEPAM 15 MG PO CAPS
15.0000 mg | ORAL_CAPSULE | Freq: Every evening | ORAL | Status: DC | PRN
  Administered 2022-01-26: 15 mg via ORAL
  Filled 2022-01-26: qty 2

## 2022-01-26 MED ORDER — METHYLENE BLUE 1 % INJ SOLN
INTRAVENOUS | Status: DC | PRN
  Administered 2022-01-26: 20 mg via INTRAVENOUS

## 2022-01-26 MED ORDER — FENTANYL CITRATE (PF) 100 MCG/2ML IJ SOLN
25.0000 ug | INTRAMUSCULAR | Status: DC | PRN
  Administered 2022-01-26: 25 ug via INTRAVENOUS

## 2022-01-26 MED ORDER — DEXAMETHASONE SODIUM PHOSPHATE 10 MG/ML IJ SOLN
INTRAMUSCULAR | Status: DC | PRN
  Administered 2022-01-26: 10 mg via INTRAVENOUS

## 2022-01-26 MED ORDER — MENTHOL 3 MG MT LOZG: 1.0000 | LOZENGE | OROMUCOSAL | Status: DC | PRN

## 2022-01-26 MED ORDER — PHENYLEPHRINE 80 MCG/ML (10ML) SYRINGE FOR IV PUSH (FOR BLOOD PRESSURE SUPPORT)
PREFILLED_SYRINGE | INTRAVENOUS | Status: DC | PRN
  Administered 2022-01-26 (×2): 160 ug via INTRAVENOUS
  Administered 2022-01-26: 40 ug via INTRAVENOUS

## 2022-01-26 MED ORDER — ROCURONIUM BROMIDE 10 MG/ML (PF) SYRINGE
PREFILLED_SYRINGE | INTRAVENOUS | Status: DC | PRN
  Administered 2022-01-26: 20 mg via INTRAVENOUS
  Administered 2022-01-26: 50 mg via INTRAVENOUS

## 2022-01-26 MED ORDER — ONDANSETRON HCL 4 MG/2ML IJ SOLN
INTRAMUSCULAR | Status: DC | PRN
  Administered 2022-01-26: 4 mg via INTRAVENOUS

## 2022-01-26 MED ORDER — ONDANSETRON HCL 4 MG PO TABS: 4.0000 mg | ORAL_TABLET | Freq: Four times a day (QID) | ORAL | Status: DC | PRN

## 2022-01-26 MED ORDER — KETOROLAC TROMETHAMINE 30 MG/ML IJ SOLN
30.0000 mg | Freq: Four times a day (QID) | INTRAMUSCULAR | Status: AC
  Administered 2022-01-26 – 2022-01-27 (×3): 30 mg via INTRAVENOUS
  Filled 2022-01-26 (×4): qty 1

## 2022-01-26 MED ORDER — ONDANSETRON HCL 4 MG/2ML IJ SOLN
INTRAMUSCULAR | Status: AC
  Filled 2022-01-26: qty 2

## 2022-01-26 MED ORDER — CEFAZOLIN SODIUM-DEXTROSE 2-4 GM/100ML-% IV SOLN
2.0000 g | INTRAVENOUS | Status: AC
  Administered 2022-01-26: 2 g via INTRAVENOUS
  Filled 2022-01-26: qty 100

## 2022-01-26 MED ORDER — IBUPROFEN 600 MG PO TABS: 600.0000 mg | ORAL_TABLET | Freq: Four times a day (QID) | ORAL | Status: DC

## 2022-01-26 MED ORDER — HYDROCODONE-ACETAMINOPHEN 5-325 MG PO TABS
1.0000 | ORAL_TABLET | ORAL | Status: DC | PRN
  Administered 2022-01-26: 1 via ORAL
  Filled 2022-01-26: qty 2

## 2022-01-26 MED ORDER — PANTOPRAZOLE SODIUM 40 MG PO TBEC
40.0000 mg | DELAYED_RELEASE_TABLET | Freq: Every day | ORAL | Status: DC
  Administered 2022-01-26 – 2022-01-27 (×2): 40 mg via ORAL
  Filled 2022-01-26 (×2): qty 1

## 2022-01-26 MED ORDER — KETOROLAC TROMETHAMINE 30 MG/ML IJ SOLN
INTRAMUSCULAR | Status: AC
  Filled 2022-01-26: qty 1

## 2022-01-26 MED ORDER — DEXAMETHASONE SODIUM PHOSPHATE 10 MG/ML IJ SOLN
INTRAMUSCULAR | Status: AC
  Filled 2022-01-26: qty 1

## 2022-01-26 MED ORDER — FENTANYL CITRATE (PF) 100 MCG/2ML IJ SOLN
INTRAMUSCULAR | Status: AC
  Filled 2022-01-26: qty 2

## 2022-01-26 MED ORDER — FENTANYL CITRATE (PF) 250 MCG/5ML IJ SOLN
INTRAMUSCULAR | Status: DC | PRN
Start: 2022-01-26 — End: 2022-01-26
  Administered 2022-01-26 (×2): 50 ug via INTRAVENOUS
  Administered 2022-01-26: 100 ug via INTRAVENOUS

## 2022-01-26 MED ORDER — PHENYLEPHRINE 80 MCG/ML (10ML) SYRINGE FOR IV PUSH (FOR BLOOD PRESSURE SUPPORT)
PREFILLED_SYRINGE | INTRAVENOUS | Status: AC
  Filled 2022-01-26: qty 10

## 2022-01-26 MED ORDER — SIMETHICONE 80 MG PO CHEW: 80.0000 mg | CHEWABLE_TABLET | Freq: Four times a day (QID) | ORAL | Status: DC | PRN

## 2022-01-26 MED ORDER — 0.9 % SODIUM CHLORIDE (POUR BTL) OPTIME
TOPICAL | Status: DC | PRN
  Administered 2022-01-26: 1000 mL

## 2022-01-26 MED ORDER — LACTATED RINGERS IV SOLN: INTRAVENOUS | Status: DC

## 2022-01-26 MED ORDER — PHENYLEPHRINE HCL-NACL 20-0.9 MG/250ML-% IV SOLN
INTRAVENOUS | Status: DC | PRN
  Administered 2022-01-26: 10 ug/min via INTRAVENOUS

## 2022-01-26 MED ORDER — LIDOCAINE 2% (20 MG/ML) 5 ML SYRINGE
INTRAMUSCULAR | Status: DC | PRN
  Administered 2022-01-26: 60 mg via INTRAVENOUS

## 2022-01-26 MED ORDER — METHYLENE BLUE 1 % INJ SOLN
INTRAVENOUS | Status: AC
  Filled 2022-01-26: qty 10

## 2022-01-26 MED ORDER — CHLORHEXIDINE GLUCONATE 0.12 % MT SOLN
OROMUCOSAL | Status: AC
  Administered 2022-01-26: 15 mL via OROMUCOSAL
  Filled 2022-01-26: qty 15

## 2022-01-26 MED ORDER — PROPOFOL 10 MG/ML IV BOLUS
INTRAVENOUS | Status: AC
Start: 2022-01-26 — End: ?
  Filled 2022-01-26: qty 20

## 2022-01-26 MED ORDER — ROCURONIUM BROMIDE 10 MG/ML (PF) SYRINGE
PREFILLED_SYRINGE | INTRAVENOUS | Status: AC
  Filled 2022-01-26: qty 10

## 2022-01-26 MED ORDER — BUPIVACAINE HCL (PF) 0.25 % IJ SOLN
INTRAMUSCULAR | Status: DC | PRN
  Administered 2022-01-26: 17 mL

## 2022-01-26 MED ORDER — PROPOFOL 10 MG/ML IV BOLUS
INTRAVENOUS | Status: AC
  Filled 2022-01-26: qty 20

## 2022-01-26 MED ORDER — POVIDONE-IODINE 10 % EX SWAB: Freq: Once | CUTANEOUS | Status: DC

## 2022-01-26 MED ORDER — KETOROLAC TROMETHAMINE 30 MG/ML IJ SOLN
INTRAMUSCULAR | Status: DC | PRN
  Administered 2022-01-26: 30 mg via INTRAVENOUS

## 2022-01-26 MED ORDER — MIDAZOLAM HCL 2 MG/2ML IJ SOLN
INTRAMUSCULAR | Status: AC
Start: 2022-01-26 — End: ?
  Filled 2022-01-26: qty 2

## 2022-01-26 MED ORDER — LACTATED RINGERS IV SOLN
INTRAVENOUS | Status: DC
Start: 2022-01-26 — End: 2022-01-27

## 2022-01-26 MED ORDER — HYDROMORPHONE HCL 1 MG/ML IJ SOLN
0.2000 mg | INTRAMUSCULAR | Status: DC | PRN
  Administered 2022-01-26: 0.5 mg via INTRAVENOUS
  Administered 2022-01-26: 0.6 mg via INTRAVENOUS
  Filled 2022-01-26 (×2): qty 1

## 2022-01-26 MED ORDER — MIDAZOLAM HCL 2 MG/2ML IJ SOLN
INTRAMUSCULAR | Status: DC | PRN
  Administered 2022-01-26: 2 mg via INTRAVENOUS

## 2022-01-26 MED ORDER — SUGAMMADEX SODIUM 200 MG/2ML IV SOLN
INTRAVENOUS | Status: DC | PRN
  Administered 2022-01-26: 200 mg via INTRAVENOUS

## 2022-01-26 MED ORDER — SODIUM CHLORIDE 0.9 % IR SOLN
Status: DC | PRN
  Administered 2022-01-26: 1000 mL

## 2022-01-26 MED ORDER — ACETAMINOPHEN 500 MG PO TABS
1000.0000 mg | ORAL_TABLET | ORAL | Status: AC
  Administered 2022-01-26: 1000 mg via ORAL
  Filled 2022-01-26: qty 2

## 2022-01-26 MED ORDER — ONDANSETRON HCL 4 MG/2ML IJ SOLN: 4.0000 mg | Freq: Four times a day (QID) | INTRAMUSCULAR | Status: DC | PRN

## 2022-01-26 MED ORDER — PROPOFOL 10 MG/ML IV BOLUS
INTRAVENOUS | Status: DC | PRN
  Administered 2022-01-26: 100 mg via INTRAVENOUS

## 2022-01-26 MED ORDER — LIDOCAINE 2% (20 MG/ML) 5 ML SYRINGE
INTRAMUSCULAR | Status: AC
  Filled 2022-01-26: qty 5

## 2022-01-26 MED ORDER — OXYCODONE-ACETAMINOPHEN 5-325 MG PO TABS: 1.0000 | ORAL_TABLET | ORAL | Status: DC | PRN

## 2022-01-26 SURGICAL SUPPLY — 53 items
ADH SKN CLS APL DERMABOND .7 (GAUZE/BANDAGES/DRESSINGS) ×2
APL SRG 38 LTWT LNG FL B (MISCELLANEOUS) ×2
APPLICATOR ARISTA FLEXITIP XL (MISCELLANEOUS) ×1 IMPLANT
BARRIER ADHS 3X4 INTERCEED (GAUZE/BANDAGES/DRESSINGS) ×4 IMPLANT
BRR ADH 4X3 ABS CNTRL BYND (GAUZE/BANDAGES/DRESSINGS) ×2
CABLE HIGH FREQUENCY MONO STRZ (ELECTRODE) ×4 IMPLANT
COVER MAYO STAND STRL (DRAPES) ×4 IMPLANT
DERMABOND ADVANCED (GAUZE/BANDAGES/DRESSINGS) ×1
DERMABOND ADVANCED .7 DNX12 (GAUZE/BANDAGES/DRESSINGS) ×3 IMPLANT
DURAPREP 26ML APPLICATOR (WOUND CARE) ×4 IMPLANT
FILTER SMOKE EVAC LAPAROSHD (FILTER) ×4 IMPLANT
GLOVE BIO SURGEON STRL SZ7 (GLOVE) ×4 IMPLANT
GLOVE SURG UNDER POLY LF SZ7 (GLOVE) ×16 IMPLANT
GOWN STRL REUS W/ TWL LRG LVL3 (GOWN DISPOSABLE) ×9 IMPLANT
GOWN STRL REUS W/TWL LRG LVL3 (GOWN DISPOSABLE) ×9
HEMOSTAT ARISTA ABSORB 3G PWDR (HEMOSTASIS) ×1 IMPLANT
HIBICLENS CHG 4% 4OZ BTL (MISCELLANEOUS) ×4 IMPLANT
IRRIG SUCT STRYKERFLOW 2 WTIP (MISCELLANEOUS) ×3
IRRIGATION SUCT STRKRFLW 2 WTP (MISCELLANEOUS) ×3 IMPLANT
KIT TURNOVER KIT B (KITS) ×4 IMPLANT
LIGASURE LAP L-HOOKWIRE 5 44CM (INSTRUMENTS) ×1 IMPLANT
LIGASURE VESSEL 5MM BLUNT TIP (ELECTROSURGICAL) ×4 IMPLANT
MANIPULATOR ADVINCU DEL 2.5 PL (MISCELLANEOUS) IMPLANT
MANIPULATOR ADVINCU DEL 3.0 PL (MISCELLANEOUS) IMPLANT
MANIPULATOR ADVINCU DEL 3.5 PL (MISCELLANEOUS) IMPLANT
MANIPULATOR ADVINCU DEL 4.0 PL (MISCELLANEOUS) IMPLANT
NS IRRIG 1000ML POUR BTL (IV SOLUTION) ×4 IMPLANT
PACK LAPAROSCOPY BASIN (CUSTOM PROCEDURE TRAY) ×4 IMPLANT
PACK TRENDGUARD 450 HYBRID PRO (MISCELLANEOUS) IMPLANT
PROTECTOR NERVE ULNAR (MISCELLANEOUS) ×8 IMPLANT
SCISSORS LAP 5X35 DISP (ENDOMECHANICALS) ×4 IMPLANT
SET CYSTO W/LG BORE CLAMP LF (SET/KITS/TRAYS/PACK) IMPLANT
SET TRI-LUMEN FLTR TB AIRSEAL (TUBING) IMPLANT
SET TUBE SMOKE EVAC HIGH FLOW (TUBING) ×4 IMPLANT
SLEEVE ENDOPATH XCEL 5M (ENDOMECHANICALS) ×4 IMPLANT
SUT MNCRL AB 4-0 PS2 18 (SUTURE) ×8 IMPLANT
SUT VIC AB 0 CT1 18XCR BRD8 (SUTURE) IMPLANT
SUT VIC AB 0 CT1 8-18 (SUTURE)
SUT VIC AB 2-0 CT1 (SUTURE) IMPLANT
SUT VICRYL 0 UR6 27IN ABS (SUTURE) ×4 IMPLANT
SUT VLOC 180 0 9IN  GS21 (SUTURE)
SUT VLOC 180 0 9IN GS21 (SUTURE) IMPLANT
SYR 10ML LL (SYRINGE) IMPLANT
SYR 50ML LL SCALE MARK (SYRINGE) IMPLANT
SYR BULB IRRIG 60ML STRL (SYRINGE) ×4 IMPLANT
TOWEL GREEN STERILE FF (TOWEL DISPOSABLE) ×12 IMPLANT
TRAY FOLEY W/BAG SLVR 14FR (SET/KITS/TRAYS/PACK) ×4 IMPLANT
TRENDGUARD 450 HYBRID PRO PACK (MISCELLANEOUS)
TROCAR PORT AIRSEAL 8X120 (TROCAR) IMPLANT
TROCAR XCEL NON-BLD 11X100MML (ENDOMECHANICALS) ×4 IMPLANT
TROCAR XCEL NON-BLD 5MMX100MML (ENDOMECHANICALS) ×4 IMPLANT
UNDERPAD 30X36 HEAVY ABSORB (UNDERPADS AND DIAPERS) ×4 IMPLANT
WARMER LAPAROSCOPE (MISCELLANEOUS) ×4 IMPLANT

## 2022-01-26 NOTE — Transfer of Care (Signed)
Immediate Anesthesia Transfer of Care Note  Patient: Brittany Thornton  Procedure(s) Performed: TOTAL LAPAROSCOPIC HYSTERECTOMY WITH SALPINGECTOMY (Abdomen) CYSTOSCOPY (Bladder)  Patient Location: PACU  Anesthesia Type:General  Level of Consciousness: drowsy  Airway & Oxygen Therapy: Patient Spontanous Breathing and Patient connected to face mask oxygen  Post-op Assessment: Report given to RN and Post -op Vital signs reviewed and stable  Post vital signs: Reviewed and stable  Last Vitals:  Vitals Value Taken Time  BP 97/62   Temp    Pulse 76   Resp 12   SpO2 100     Last Pain:  Vitals:   01/26/22 0718  TempSrc: Oral         Complications: No notable events documented.

## 2022-01-26 NOTE — Anesthesia Procedure Notes (Signed)
Procedure Name: Intubation Date/Time: 01/26/2022 9:19 AM  Performed by: Lorie Phenix, CRNAPre-anesthesia Checklist: Patient identified, Emergency Drugs available, Suction available and Patient being monitored Patient Re-evaluated:Patient Re-evaluated prior to induction Oxygen Delivery Method: Circle system utilized Preoxygenation: Pre-oxygenation with 100% oxygen Induction Type: IV induction Ventilation: Mask ventilation without difficulty Laryngoscope Size: Mac and 3 Grade View: Grade I Tube type: Oral Tube size: 7.5 mm Number of attempts: 1 Airway Equipment and Method: Stylet Placement Confirmation: ETT inserted through vocal cords under direct vision, positive ETCO2 and breath sounds checked- equal and bilateral Secured at: 22 cm Tube secured with: Tape Dental Injury: Teeth and Oropharynx as per pre-operative assessment

## 2022-01-26 NOTE — TOC Initial Note (Signed)
Transition of Care [facility]) - Initial/Assessment Note    Patient Details  Name: Brittany Thornton MRN: [id number] Date of Birth: 04-25-1976  Transition of Care [facility]) CM/SW Contact:    Ninfa Meeker, RN Phone Number: 01/26/2022, 12:39 PM  Clinical Narrative:                 Transition of Care Screening Note:  Transition of Care Department [facility]) has reviewed patient and no TOC needs have been identified at this time. We will continue to monitor patient advancement through Interdisciplinary progressions. If new patient transition needs arise, please place a consult.          Patient Goals and CMS Choice        Expected Discharge Plan and Services                                                Prior Living Arrangements/Services                       Activities of Daily Living      Permission Sought/Granted                  Emotional Assessment              Admission diagnosis:  Leiomyoma of uterus [D25.9] S/P laparoscopic hysterectomy [Z90.710] Patient Active Problem List   Diagnosis Date Noted   Leiomyoma of uterus 01/26/2022   S/P laparoscopic hysterectomy 01/26/2022   Rhinitis, allergic 03/23/2015   Oral allergy syndrome 03/23/2015   Abnormal PFTs 05/16/2013   Anemia 11/11/2011   Contraception 11/11/2011   Asthma 11/11/2011   History of kidney stones 11/11/2011   PCP:  Donald Prose, MD Pharmacy:   [facility] [id number], [place] [address] Phone: [phone] Fax: [phone]  [facility] (New Address) - [place], [place] AT Previously: [address], [place] [place] [address] [place] [address] Phone: [phone] Fax: [facility] [id number] [place], [place] - [address] [place] [address] Phone: [phone]  Fax: [phone]  [facility] [id number] - [address] [place] [address] Phone: [phone] Fax: [place], [address] AT [facility] [address] [place] [place] [address] Phone: [phone] Fax: [phone]     Social Determinants of Health (SDOH) Interventions    Readmission Risk Interventions     No data to display

## 2022-01-26 NOTE — Plan of Care (Signed)
  Problem: Education: Goal: Knowledge of General Education information will improve Description Including pain rating scale, medication(s)/side effects and non-pharmacologic comfort measures Outcome: Progressing   Problem: Health Behavior/Discharge Planning: Goal: Ability to manage health-related needs will improve Outcome: Progressing   

## 2022-01-26 NOTE — H&P (Signed)
MD GYN HISTORY AND PHYSICAL  Admission Date: 01/26/2022  6:55 AM  Admit Diagnosis: Abnormal uterine bleeding; fibroid uterus Patient Name: Brittany Thornton        MRN#: [id number]  Subjective:    Patient is a 46 y.o. female G0P0 presents for scheduled total laparoscopic hysterectomy bilateral salpingectomy.  Patient notes that starting approximately 6 months ago she started bleeding very heavy. The bleeding has continued until now. She notes that at times the bleeding was very heavy and she was passing very large clots. She was using a super or overnight pad and changing every 2 hours in order to prevent flooding her clothes. Endometrial curettings were negative for hyperplasia or malignancy.   Pertinent Gynecological History: Menses:flow is excessive with use of 7 pads or tampons on heaviest days  Bleeding: dysfunctional uterine bleeding Contraception: none DES exposure: denies Blood transfusions: none Sexually transmitted diseases: no past history Previous GYN Procedures: DNC  Last mammogram: normal Date: 2022 Last pap: normal Date: 2022 OB History: G0, P0   Menstrual History: Menarche age: 63  Medical / Surgical History: Past medical history:  Past Medical History:  Diagnosis Date   Abnormal Pap smear    ASCUS   Anemia    Asthma    Dizziness 2005   Dizziness 2005   Dyspareunia    Elevated prolactin level 2004   Galactorrhea 2004   Hematuria    History of kidney stones    Nausea 2005   Rectal itching    SOB (shortness of breath) 2005   Vaginal burning 2006    Past surgical history:  Past Surgical History:  Procedure Laterality Date   DILATION AND CURETTAGE OF UTERUS     Family History:  Family History  Problem Relation Age of Onset   Asthma Sister    Heart disease Mother    Allergic rhinitis Neg Hx    Atopy Neg Hx    Angioedema Neg Hx    Eczema Neg Hx    Immunodeficiency Neg Hx    Urticaria Neg Hx     Social History:  reports that she has never  smoked. She has never used smokeless tobacco. She reports that she does not currently use alcohol. She reports that she does not use drugs.  Allergies: Allergies  Allergen Reactions   Codeine Nausea And Vomiting and Other (See Comments)    Hallucinations    Other Itching and Swelling    Oral pollinosis - fruit   Oxycodone Nausea And Vomiting and Other (See Comments)    Hallucinations    Latex Other (See Comments)    Advised by MD to add as an allergy due to severe sensitive skin      Current Medications at time of admission:  Prior to Admission medications   Medication Sig Start Date End Date Taking? Authorizing Provider  AUVI-Q 0.3 MG/0.3ML SOAJ injection INJECT AS NEEDED FOR SEVERE ALLERGIC REACTION INCLUDING ANAPHYLAXIS AS DIRECTED Patient taking differently: Inject 0.3 mg into the muscle as needed for anaphylaxis. 09/03/21  Yes Kozlow, Donnamarie Poag, MD  BORIC ACID EX Take 600 mg by mouth once a week.   Yes [provider]  cetirizine (ZYRTEC) 10 MG tablet Take 1 tablet (10 mg total) by mouth 2 (two) times daily as needed for allergies (Can take an extra dose during flare ups.). 11/04/21  Yes Kozlow, Donnamarie Poag, MD  Cholecalciferol 125 MCG (5000 UT) TABS Take 5,000 Units by mouth daily.   Yes [provider]  cyproheptadine (PERIACTIN) 4  MG tablet Take 1 tablet (4 mg total) by mouth at bedtime. Can take a half tablet if you feel the full dose is not needed. 11/04/21  Yes Kozlow, Donnamarie Poag, MD  Ferrous Sulfate (SLOW FE PO) Take 1 capsule by mouth daily.   Yes [provider]  fluticasone (FLOVENT HFA) 110 MCG/ACT inhaler Inhale 2 puffs into the lungs 3 (three) times daily as needed. Patient taking differently: Inhale 2 puffs into the lungs 3 (three) times daily as needed (wheezing, sob). 11/14/21  Yes Ambs, Kathrine Cords, FNP  levalbuterol [facility] HFA) 45 MCG/ACT inhaler Inhale 1-2 puffs into the lungs every 6 (six) hours as needed for wheezing. 11/04/21  Yes Kozlow, Donnamarie Poag, MD   levothyroxine (SYNTHROID) 75 MCG tablet Take 75 mcg by mouth daily before breakfast.   Yes [provider]  MAGNESIUM PO Take 1 capsule by mouth daily.   Yes [provider]  mometasone (ELOCON) 0.1 % cream Apply topically 2 (two) times daily as needed. APPLY TO AFFECTED AREA(S) 1 TO 2 TIMES PER DAY AS NEEDED Patient taking differently: Apply 1 Application topically 2 (two) times daily as needed (irritation). 11/04/21  Yes Kozlow, Donnamarie Poag, MD  Multiple Vitamin (MULTIVITAMIN) tablet Take 1 tablet by mouth daily.   Yes [provider]  OVER THE COUNTER MEDICATION Take 1 capsule by mouth daily. Olly Supplement   Yes [provider]  Saccharomyces boulardii (PROBIOTIC) 250 MG CAPS Take 250 mg by mouth daily.   Yes [provider]  Turmeric 500 MG CAPS Take 500 mg by mouth daily.   Yes [provider]  beclomethasone (QVAR REDIHALER) 40 MCG/ACT inhaler Inhale 2 puffs into the lungs daily. Patient not taking: Reported on 01/21/2022 11/04/21   Jiles Prows, MD  famotidine (PEPCID) 40 MG tablet Take 1 tablet daily in the afternoon Patient not taking: Reported on 01/21/2022 10/29/20   Jiles Prows, MD  omeprazole (PRILOSEC) 40 MG capsule Take 1 capsule (40 mg total) by mouth daily. Patient not taking: Reported on 01/21/2022 11/04/21   Jiles Prows, MD  norethindrone-ethinyl estradiol (MICROGESTIN,JUNEL,LOESTRIN) 1-20 MG-MCG tablet Take 1 tablet by mouth daily. 11/11/11 04/27/12  Ena Dawley, MD    Review of Systems: Constitutional: Negative   HENT: Negative   Eyes: Negative   Respiratory: Negative   Cardiovascular: Negative   Gastrointestinal: Negative  Genitourinary: negative currently for vaginal bleeding   Musculoskeletal: Negative   Skin: Negative   Neurological: Negative   Endo/Heme/Allergies: Negative   Psychiatric/Behavioral: Negative      Objective:     Physical Exam: VS: Blood pressure 122/75, pulse 83, temperature 98.3 F  (36.8 C), temperature source Oral, resp. rate 18, height '5\' 3"'$  (1.6 m), weight 68 kg, last menstrual period 01/09/2022, SpO2 100 %. Physical Exam General:   alert, cooperative, and no distress  Skin:   normal and no rash or abnormalities  Lungs:   clear to auscultation bilaterally  Heart:   regular rate and rhythm, S1, S2 normal, no murmur, click, rub or gallop  Abdomen:  soft, non-tender; bowel sounds normal; no masses,  no organomegaly  Pelvis:  Exam deferred.   Labs / Imaging: Results for orders placed or performed during the hospital encounter of 01/26/22 (from the past 24 hour(s))  ABO/Rh     Status: None (Preliminary result)   Collection Time: 01/26/22  8:24 AM  Result Value Ref Range   ABO/RH(D) PENDING   Pregnancy, urine POC     Status: None  Collection Time: 01/26/22  8:29 AM  Result Value Ref Range   Preg Test, Ur NEGATIVE NEGATIVE   Pelvic US in office 03/2021: GYN [place]: Multiple fibroids - subserosal , intramural. Left ovarian cyst - small, c/w hemorrhagic cyst    Assessment:        Patient is a 46 y.o. y.o G0P0 with diagnosis of  symptomatic fibroid uterus manifested by abnormal uterine bleeding.        Plan:    I had a lengthy discussion with the patient regarding her diagnosis. She was counseled about the procedure, risks, reasons, benefits and complications to include: injury to bowel, bladder, major blood vessel, ureter, bleeding, possibility of transfusion, infection, abnormal scar formation and the possibility of diagnosis of malignancy requiring further medical or surgical treatment. All the above was reviewed in detail.  All inquiries made by patient were answered.  Consent was signed, witnessed and placed into chart. Post op Instructions were reviewed, including office follow up.      Sanjuana Kava MD 01/26/2022, 8:54 AM

## 2022-01-26 NOTE — Anesthesia Postprocedure Evaluation (Signed)
Anesthesia Post Note  Patient: Brittany Thornton  Procedure(s) Performed: TOTAL LAPAROSCOPIC HYSTERECTOMY WITH SALPINGECTOMY (Abdomen) CYSTOSCOPY (Bladder)     Patient location during evaluation: PACU Anesthesia Type: General Level of consciousness: awake and alert Pain management: pain level controlled Vital Signs Assessment: post-procedure vital signs reviewed and stable Respiratory status: spontaneous breathing, nonlabored ventilation and respiratory function stable Cardiovascular status: blood pressure returned to baseline and stable Postop Assessment: no apparent nausea or vomiting Anesthetic complications: no   No notable events documented.  Last Vitals:  Vitals:   01/26/22 1150 01/26/22 1205  BP: 101/69 106/70  Pulse: 66 74  Resp: 20 13  Temp:    SpO2: 98% 100%    Last Pain:  Vitals:   01/26/22 1150  TempSrc:   PainSc: 5                  Jaunita Mikels,W. EDMOND

## 2022-01-26 NOTE — Op Note (Signed)
OPERATIVE NOTE  Brittany Thornton  DOB:    [birth year]  MRN:    [id number]  CSN:    [id number]  Date of Surgery:  01/26/2022  Preoperative Diagnosis: Fibroid uterus Abnormal uterine bleeding  Postoperative Diagnosis: Same as above plus Dense pelvic adhesions from large bowel to pelvis  Procedure(s): Total Laparoscopic hysterectomy Bilateral salpingectomy Cystoscopy  Surgeon: Mady Haagensen. Alwyn Pea, M.D.  Assistant: Waymon Amato, M.D.  Anesthetic: General  ETA  EBL: 20 cc  IVF:  1000 mL LR  UOP:  100 mL Clear urine ( Foley)  Specimen:  Uterus, cervix, bilateral fallopian tubes  Complications:  None  Disposition: The patient presents with the above-mentioned diagnosis. She understands the indications for surgical procedure.  She also understands the alternative treatment options. She accepts the risk of, but not limited to, anesthetic complications, bleeding, infections, and possible damage to the surrounding organs.   Findings: Exam under anesthesia: external vulva: normal vaginal vault: normal ruggae  cervix: grossly normal Uterus: retroverted, mobile, 9 wks size, palpable fibroids, adnexa: no palpable masses Laparoscopy: Uterus with multiple pedunculated fundal and anterior fibroids.  Ovaries: small, appear normal bilaterally   Fallopian tubes: normal in appearance bilaterally Dense adhesions from large bowel and the epiploica of the large bowel to the left and right adnexa and obliterating most of the posterior cul de sac.   Procedure: The patient was brought into the operating room with an intravenous line in placed, and anesthetic was administered. She was placed in a low dorsal lithotomy position using Allen stirrups. The patient's  abdomen was prepped with ChloraPrep. The perineum and vagina were prepped with multiple layers of Betadine the bladder catheterized with a foley.    A Time out was performed confirming the patient name and procedure. A weighted speculum was  placed in the posterior vagina and a Deaver anteriorly. The anterior lip of the cervix was held with a single tooth tenaculum. The uterus was sounded to 7 cm  and dilated with Kennon Rounds dilators. The tenaculum was removed and the anterior lip of the cervix held with a stitch of 0-vicryl. 3.5 cm Advincula uterine manipulator was placed and the intrauterine balloon inflated. The  Speculum was removed. Surgeons gloves and gown were changed and attention turned to the abdomen.   The patient was sterilely draped. The subumbilical area was injected with half percent Marcaine.  An incision was made in the umbilicus with 11 blade scalpel after infiltration with 0.5% Marcaine. A 84m direct entry trocar was used with l012m0 degree laparoscope attached to perform direct entry into the patient's abdomen. Direct video visualization confirmed entry into the abdomen and pneumoperitoneum was obtained using approximately 3L CO2 gas. The patient was placed in steep Trendelenburg position. After infiltration with Marcaine, a small incision was made and a 8 mm Airseal trocar was inserted into the abdominal cavity along the left lower pelvis under direct visualization.  Another 75m59mtrocar was placed along the mid upper left abdomen. A third 75mm675mocar was placed along the right lower pelvis. There was no injury noted with placement of trocars. The pelvic contents were visualized and findings noted above, both ureters were identified crossing the pelvic brim and pelvic sidewall coursing well below operative field.  Pictures were taken of the patient's pelvic structures.    The left fallopian tube was identified and followed to its fimbriated end. The proximal portion of the left fallopian tube was cauterized and cut. The left round ligament was cauterized and transected using the  5 mm Ligasure. The left utero-ovarian ligament was cauterized and cut. The bladder flap was developed by entering the anterior broad ligament.  Dense  adhesions from the epiploica of the large bowel were both sharply and bluntly taken down from its attachment to the left adnexa and the posterior aspect of the uterus. The right fallopian tube was identified and followed to its fimbriated end. The proximal portion of the right fallopian tube was cauterized and cut. The right upper pedicle was then isolated. The right round ligament was then cauterized and transected via the Ligasure. The anterior broad ligament was then isolated and the the mesovarium was skeletonized. A bladder flap was mobilized with the 5 mm Ligasure and the peritoneum on the vesicouterine fold was incised to mobilize the bladder.The right utero-ovarian ligament was cauterized and cut. The bladder flap was developed further. Once the [facility] colpotomy ring was skeletonized and in position, the uterine arteries were also skeletonized and fatty bowel epiploica taken down to accurately trace uterine insertion.  The right ureter was visualized well.  Bilateral uterine arteries were sealed and transected using the 11m Ligasure at the level of the colpotomy ring.     The vagina was transected using the hook on the 5 mm Ligasure resulting in separation of the uterus and attached tubes. The uterus, cervix and tubes were then delivered through the vagina. The vaginal occluder was insufflated with 60 cc of water and the vaginal vault was laparoscopically with 0-v-loc suture.  The surgical technician confirmed that the vault was closed by placing a finger in the vagina and there were no notable defects. Irrigation was performed and the vaginal cuff and the pedicles were noted to be hemostatic.   Arista was placed over the pedicle sites and the vaginal cuff.    A cystoscopy was then performed using a 70 degree cystoscopy and a complete bladder survey was performed. There was no noted injury to the bladder urothelium and there were brisk jets from both ureteral orifices.    The accessory ports were removed  under direct laparoscopic guidance and  the pneumoperitoneum was released via the Air seal device.  The umbilical port was removed using laparoscopic guidance. The umbilical fascia was closed with an interrupted figure-of-eight stitch using 0 Vicryl on a UR-6. The skin was closed with subcuticular stitches using 4-0 Monocryl suture. Dermabond was used to cover each incision site and op sites placed.  The final sponge, needle, and instrument counts were correct at the completion of the procedure. The patient tolerated the procedure well and was awakened and taken to the post anesthesia care unit in stable condition.      Attending Attestation: I PERFORMED THE PROCEDURE AND MY ASSISTANT WAS NEEDED FOR THE COMPLEXITY OF THE CASE    WSanjuana Kava MD  01/26/2022 11:21 AM

## 2022-01-27 ENCOUNTER — Encounter (HOSPITAL_COMMUNITY): Payer: Self-pay | Admitting: Obstetrics & Gynecology

## 2022-01-27 ENCOUNTER — Other Ambulatory Visit (HOSPITAL_COMMUNITY): Payer: Self-pay

## 2022-01-27 DIAGNOSIS — D259 Leiomyoma of uterus, unspecified: Secondary | ICD-10-CM | POA: Diagnosis not present

## 2022-01-27 LAB — SURGICAL PATHOLOGY

## 2022-01-27 MED ORDER — IBUPROFEN 600 MG PO TABS
600.0000 mg | ORAL_TABLET | Freq: Four times a day (QID) | ORAL | 3 refills | Status: DC
  Filled 2022-01-27: qty 60, 15d supply, fill #0

## 2022-01-27 MED ORDER — HYDROCODONE-ACETAMINOPHEN 5-325 MG PO TABS
1.0000 | ORAL_TABLET | ORAL | 0 refills | Status: DC | PRN
  Filled 2022-01-27: qty 30, 4d supply, fill #0

## 2022-01-27 MED ORDER — SIMETHICONE 80 MG PO CHEW
80.0000 mg | CHEWABLE_TABLET | Freq: Four times a day (QID) | ORAL | 3 refills | Status: AC | PRN
Start: 2022-01-27 — End: ?
  Filled 2022-01-27: qty 30, 8d supply, fill #0

## 2022-01-27 NOTE — Discharge Summary (Signed)
Physician Discharge Summary  Patient ID: Brittany Thornton MRN: [id number] DOB/AGE: 11/17/1975 46 y.o.  Admit date: 01/26/2022 Discharge date: 01/27/2022  Admission Diagnoses: Fibroid uterus  Discharge Diagnoses:  Principal Problem:   Leiomyoma of uterus Active Problems:   S/P laparoscopic hysterectomy   Discharged Condition: good  Hospital Course: Patient taken to operating room where below procedures were performed.  There were no intraoperative or post operative complications.  Patient progressed well in the hospital and on POD#1 was meeting all discharge criteria and was discharged home.    Consults: None  Significant Diagnostic Studies: labs: post op cbc  Treatments: IV hydration, antibiotics: Ancef, analgesia: acetaminophen w/ codeine, and surgery: TLH/ BS/ Cystoscopy  Discharge Exam: Blood pressure 100/70, pulse 83, temperature 98.4 F (36.9 C), temperature source Oral, resp. rate 16, height '5\' 3"'$  (1.6 m), weight 68 kg, last menstrual period 01/09/2022, SpO2 100 %. General appearance: alert, cooperative, and no distress Resp: clear to auscultation bilaterally Cardio: regular rate and rhythm, S1, S2 normal, no murmur, click, rub or gallop GI: soft, non-tender; bowel sounds normal; no masses,  no organomegaly and incisions clean dry intact Pelvic: deferred Extremities: extremities normal, atraumatic, no cyanosis or edema and Homans sign is negative, no sign of DVT  Disposition: Discharge disposition: 01-Home or Self Care       Discharge Instructions     Call MD for:   Complete by: As directed    Call Dr. Alwyn Pea or call office immediately if you are having very heavy vaginal bleeding (i.e. soaking a pad an hour) or passing very large clots frequently.  Look vaginal infection to include abnormal appearing discharge with odor or pus   Call MD for:  difficulty breathing, headache or visual disturbances   Complete by: As directed    Call MD for:  extreme fatigue    Complete by: As directed    Call MD for:  hives   Complete by: As directed    Call MD for:  persistant dizziness or light-headedness   Complete by: As directed    Call MD for:  persistant nausea and vomiting   Complete by: As directed    Call MD for:  redness, tenderness, or signs of infection (pain, swelling, redness, odor or green/yellow discharge around incision site)   Complete by: As directed    Call MD for:  severe uncontrolled pain   Complete by: As directed    Call MD for:  temperature >100.4   Complete by: As directed    Diet - low sodium heart healthy   Complete by: As directed    Discharge instructions   Complete by: As directed    If you have not made a follow up visit please call [place] office at [phone] or if you have any questions or concerns.   Discharge wound care:   Complete by: As directed    Your incisions have skin glue which will dissolve over time.  You may shower, no baths. Don't use lotions or savs on incision sites.   Driving Restrictions   Complete by: As directed    No driving for 10 days or while taking vicodin   Increase activity slowly   Complete by: As directed    Lifting restrictions   Complete by: As directed    No heavy lifting, nothing heavier than a gallon a milk or approximately 20 pounds   Other Restrictions   Complete by: As directed    Showers only no baths, pools  Allergies as of 01/27/2022       Reactions   Codeine Nausea And Vomiting, Other (See Comments)   Hallucinations    Other Itching, Swelling   Oral pollinosis - fruit   Oxycodone Nausea And Vomiting, Other (See Comments)   Hallucinations    Latex Other (See Comments)   Advised by MD to add as an allergy due to severe sensitive skin         Medication List     TAKE these medications    Auvi-Q 0.3 mg/0.3 mL Soaj injection Generic drug: EPINEPHrine INJECT AS NEEDED FOR SEVERE ALLERGIC REACTION INCLUDING ANAPHYLAXIS AS DIRECTED What changed: See  the new instructions.   BORIC ACID EX Take 600 mg by mouth once a week.   cetirizine 10 MG tablet Commonly known as: ZYRTEC Take 1 tablet (10 mg total) by mouth 2 (two) times daily as needed for allergies (Can take an extra dose during flare ups.).   Cholecalciferol 125 MCG (5000 UT) Tabs Take 5,000 Units by mouth daily.   cyproheptadine 4 MG tablet Commonly known as: PERIACTIN Take 1 tablet (4 mg total) by mouth at bedtime. Can take a half tablet if you feel the full dose is not needed.   famotidine 40 MG tablet Commonly known as: PEPCID Take 1 tablet daily in the afternoon   fluticasone 110 MCG/ACT inhaler Commonly known as: Flovent HFA Inhale 2 puffs into the lungs 3 (three) times daily as needed. What changed: reasons to take this   HYDROcodone-acetaminophen 5-325 MG tablet Commonly known as: NORCO/VICODIN Take 1-2 tablets by mouth every 4 (four) hours as needed for moderate pain or severe pain.   ibuprofen 600 MG tablet Commonly known as: ADVIL Take 1 tablet (600 mg total) by mouth every 6 (six) hours.   levalbuterol 45 MCG/ACT inhaler Commonly known as: Xopenex HFA Inhale 1-2 puffs into the lungs every 6 (six) hours as needed for wheezing.   levothyroxine 75 MCG tablet Commonly known as: SYNTHROID Take 75 mcg by mouth daily before breakfast.   MAGNESIUM PO Take 1 capsule by mouth daily.   mometasone 0.1 % cream Commonly known as: ELOCON Apply topically 2 (two) times daily as needed. APPLY TO AFFECTED AREA(S) 1 TO 2 TIMES PER DAY AS NEEDED What changed:  how much to take reasons to take this additional instructions   multivitamin tablet Take 1 tablet by mouth daily.   omeprazole 40 MG capsule Commonly known as: PRILOSEC Take 1 capsule (40 mg total) by mouth daily.   OVER THE COUNTER MEDICATION Take 1 capsule by mouth daily. Olly Supplement   Probiotic 250 MG Caps Take 250 mg by mouth daily.   Qvar RediHaler 40 MCG/ACT inhaler Generic drug:  beclomethasone Inhale 2 puffs into the lungs daily.   simethicone 80 MG chewable tablet Commonly known as: MYLICON Chew 1 tablet (80 mg total) by mouth 4 (four) times daily as needed for flatulence.   SLOW FE PO Take 1 capsule by mouth daily.   Turmeric 500 MG Caps Take 500 mg by mouth daily.               Discharge Care Instructions  (From admission, onward)           Start     Ordered   01/27/22 0000  Discharge wound care:       Comments: Your incisions have skin glue which will dissolve over time.  You may shower, no baths. Don't use lotions or savs on incision  sites.   01/27/22 1031             Signed: Rogelio Seen Lanina Larranaga 01/27/2022, 10:46 AM

## 2022-01-27 NOTE — Progress Notes (Signed)
Rosette Reveal to be D/C'd  per MD order.  Discussed with the patient and all questions fully answered.  VSS, Skin clean, dry and intact without evidence of skin break down, no evidence of skin tears noted.  IV catheter discontinued intact. Site without signs and symptoms of complications. Dressing and pressure applied.  An After Visit Summary was printed and given to the patient. Patient received prescription from [place].  D/c education completed with patient/family including follow up instructions, medication list, d/c activities limitations if indicated, with other d/c instructions as indicated by MD - patient able to verbalize understanding, all questions fully answered.   Patient instructed to return to ED, call 911, or call MD for any changes in condition.   Patient to be escorted via [place], and D/C home via private auto.

## 2022-03-31 NOTE — Telephone Encounter (Signed)
Patient called stating she has had the surgery, but has been dealing with an allergic reaction from it. They have not been able to figure out what is causing of the rash. She wanted to inform us that she is still holding on getting allergy injections until the reaction is resolved.

## 2022-04-07 ENCOUNTER — Other Ambulatory Visit (HOSPITAL_COMMUNITY): Payer: Self-pay

## 2022-04-15 ENCOUNTER — Other Ambulatory Visit: Payer: Self-pay | Admitting: Obstetrics & Gynecology

## 2022-04-15 DIAGNOSIS — R102 Pelvic and perineal pain: Secondary | ICD-10-CM

## 2022-04-28 ENCOUNTER — Other Ambulatory Visit: Payer: Self-pay | Admitting: Obstetrics & Gynecology

## 2022-04-28 DIAGNOSIS — R109 Unspecified abdominal pain: Secondary | ICD-10-CM

## 2022-04-28 DIAGNOSIS — R102 Pelvic and perineal pain: Secondary | ICD-10-CM

## 2022-04-30 ENCOUNTER — Other Ambulatory Visit: Payer: BC Managed Care – PPO

## 2022-04-30 ENCOUNTER — Ambulatory Visit
Admission: RE | Admit: 2022-04-30 | Discharge: 2022-04-30 | Disposition: A | Discharge status: Home / Self Care | Payer: BC Managed Care – PPO | Source: Ambulatory Visit | Attending: Obstetrics & Gynecology | Admitting: Obstetrics & Gynecology

## 2022-04-30 DIAGNOSIS — R109 Unspecified abdominal pain: Secondary | ICD-10-CM

## 2022-04-30 DIAGNOSIS — R102 Pelvic and perineal pain: Secondary | ICD-10-CM

## 2022-05-04 ENCOUNTER — Other Ambulatory Visit: Payer: BC Managed Care – PPO

## 2022-06-01 ENCOUNTER — Telehealth: Payer: Self-pay | Admitting: *Deleted

## 2022-06-01 ENCOUNTER — Ambulatory Visit: Payer: Self-pay | Admitting: *Deleted

## 2022-06-01 NOTE — Telephone Encounter (Signed)
Patient called and stated that she would like to come back in and restart her allergy injections. She had been away due to having surgery and recovering and just received medical clearance to resume injections. Her last injection was 01/15/2022 at .89m of her new Red vials which are good until 10/15/2022. Where would you like the patient to start back with her injections?

## 2022-06-02 NOTE — Telephone Encounter (Signed)
Called and spoke with patient and advised. Patient verbalized understanding and states that she will come in next week to start back on injections.

## 2022-06-09 ENCOUNTER — Ambulatory Visit (INDEPENDENT_AMBULATORY_CARE_PROVIDER_SITE_OTHER): Payer: BC Managed Care – PPO | Admitting: *Deleted

## 2022-06-09 DIAGNOSIS — J309 Allergic rhinitis, unspecified: Secondary | ICD-10-CM | POA: Diagnosis not present

## 2022-06-18 ENCOUNTER — Ambulatory Visit (INDEPENDENT_AMBULATORY_CARE_PROVIDER_SITE_OTHER): Payer: BC Managed Care – PPO

## 2022-06-18 DIAGNOSIS — J309 Allergic rhinitis, unspecified: Secondary | ICD-10-CM

## 2022-07-14 ENCOUNTER — Ambulatory Visit (INDEPENDENT_AMBULATORY_CARE_PROVIDER_SITE_OTHER): Payer: BC Managed Care – PPO

## 2022-07-14 DIAGNOSIS — J309 Allergic rhinitis, unspecified: Secondary | ICD-10-CM

## 2022-07-23 ENCOUNTER — Ambulatory Visit (INDEPENDENT_AMBULATORY_CARE_PROVIDER_SITE_OTHER): Payer: BC Managed Care – PPO

## 2022-07-23 DIAGNOSIS — J309 Allergic rhinitis, unspecified: Secondary | ICD-10-CM

## 2022-07-30 ENCOUNTER — Ambulatory Visit (INDEPENDENT_AMBULATORY_CARE_PROVIDER_SITE_OTHER): Payer: BC Managed Care – PPO | Admitting: *Deleted

## 2022-07-30 DIAGNOSIS — J309 Allergic rhinitis, unspecified: Secondary | ICD-10-CM

## 2022-08-13 ENCOUNTER — Ambulatory Visit (INDEPENDENT_AMBULATORY_CARE_PROVIDER_SITE_OTHER): Payer: BC Managed Care – PPO

## 2022-08-13 DIAGNOSIS — J309 Allergic rhinitis, unspecified: Secondary | ICD-10-CM

## 2022-08-18 ENCOUNTER — Ambulatory Visit (INDEPENDENT_AMBULATORY_CARE_PROVIDER_SITE_OTHER): Payer: BC Managed Care – PPO

## 2022-08-18 DIAGNOSIS — J309 Allergic rhinitis, unspecified: Secondary | ICD-10-CM | POA: Diagnosis not present

## 2022-08-27 ENCOUNTER — Ambulatory Visit (INDEPENDENT_AMBULATORY_CARE_PROVIDER_SITE_OTHER): Payer: BC Managed Care – PPO

## 2022-08-27 DIAGNOSIS — J309 Allergic rhinitis, unspecified: Secondary | ICD-10-CM | POA: Diagnosis not present

## 2022-09-01 ENCOUNTER — Ambulatory Visit (INDEPENDENT_AMBULATORY_CARE_PROVIDER_SITE_OTHER): Payer: BC Managed Care – PPO | Admitting: Allergy & Immunology

## 2022-09-01 DIAGNOSIS — J309 Allergic rhinitis, unspecified: Secondary | ICD-10-CM | POA: Diagnosis not present

## 2022-09-28 ENCOUNTER — Ambulatory Visit (INDEPENDENT_AMBULATORY_CARE_PROVIDER_SITE_OTHER): Payer: BC Managed Care – PPO | Admitting: *Deleted

## 2022-09-28 DIAGNOSIS — J309 Allergic rhinitis, unspecified: Secondary | ICD-10-CM | POA: Diagnosis not present

## 2022-09-29 DIAGNOSIS — J3089 Other allergic rhinitis: Secondary | ICD-10-CM | POA: Diagnosis not present

## 2022-09-29 NOTE — Progress Notes (Signed)
VIALS EXP 09-29-23

## 2022-10-29 ENCOUNTER — Ambulatory Visit (INDEPENDENT_AMBULATORY_CARE_PROVIDER_SITE_OTHER): Payer: BC Managed Care – PPO

## 2022-10-29 DIAGNOSIS — J309 Allergic rhinitis, unspecified: Secondary | ICD-10-CM | POA: Diagnosis not present

## 2022-11-05 ENCOUNTER — Ambulatory Visit (INDEPENDENT_AMBULATORY_CARE_PROVIDER_SITE_OTHER): Payer: BC Managed Care – PPO

## 2022-11-05 DIAGNOSIS — J309 Allergic rhinitis, unspecified: Secondary | ICD-10-CM

## 2022-11-12 ENCOUNTER — Ambulatory Visit (INDEPENDENT_AMBULATORY_CARE_PROVIDER_SITE_OTHER): Payer: BC Managed Care – PPO

## 2022-11-12 DIAGNOSIS — J309 Allergic rhinitis, unspecified: Secondary | ICD-10-CM | POA: Diagnosis not present

## 2022-11-20 ENCOUNTER — Other Ambulatory Visit: Payer: Self-pay | Admitting: Allergy and Immunology

## 2022-11-23 ENCOUNTER — Ambulatory Visit (INDEPENDENT_AMBULATORY_CARE_PROVIDER_SITE_OTHER): Payer: BC Managed Care – PPO | Admitting: *Deleted

## 2022-11-23 DIAGNOSIS — J309 Allergic rhinitis, unspecified: Secondary | ICD-10-CM | POA: Diagnosis not present

## 2022-12-01 ENCOUNTER — Ambulatory Visit (INDEPENDENT_AMBULATORY_CARE_PROVIDER_SITE_OTHER): Payer: BC Managed Care – PPO

## 2022-12-01 DIAGNOSIS — J309 Allergic rhinitis, unspecified: Secondary | ICD-10-CM

## 2023-01-08 ENCOUNTER — Ambulatory Visit (INDEPENDENT_AMBULATORY_CARE_PROVIDER_SITE_OTHER): Payer: BC Managed Care – PPO

## 2023-01-08 DIAGNOSIS — J309 Allergic rhinitis, unspecified: Secondary | ICD-10-CM

## 2023-01-26 ENCOUNTER — Other Ambulatory Visit: Payer: Self-pay | Admitting: Psychiatry

## 2023-01-26 ENCOUNTER — Other Ambulatory Visit: Payer: Self-pay | Admitting: Obstetrics & Gynecology

## 2023-01-26 DIAGNOSIS — R928 Other abnormal and inconclusive findings on diagnostic imaging of breast: Secondary | ICD-10-CM

## 2023-01-26 DIAGNOSIS — Z1231 Encounter for screening mammogram for malignant neoplasm of breast: Secondary | ICD-10-CM

## 2023-01-29 ENCOUNTER — Other Ambulatory Visit: Payer: Self-pay | Admitting: Allergy and Immunology

## 2023-02-04 ENCOUNTER — Ambulatory Visit
Admission: RE | Admit: 2023-02-04 | Discharge: 2023-02-04 | Disposition: A | Discharge status: Home / Self Care | Payer: BC Managed Care – PPO | Source: Ambulatory Visit | Attending: Obstetrics & Gynecology | Admitting: Obstetrics & Gynecology

## 2023-02-04 DIAGNOSIS — R928 Other abnormal and inconclusive findings on diagnostic imaging of breast: Secondary | ICD-10-CM

## 2023-02-09 ENCOUNTER — Ambulatory Visit (INDEPENDENT_AMBULATORY_CARE_PROVIDER_SITE_OTHER): Payer: BC Managed Care – PPO

## 2023-02-09 DIAGNOSIS — J309 Allergic rhinitis, unspecified: Secondary | ICD-10-CM | POA: Diagnosis not present

## 2023-03-23 ENCOUNTER — Ambulatory Visit (INDEPENDENT_AMBULATORY_CARE_PROVIDER_SITE_OTHER): Payer: Self-pay | Admitting: *Deleted

## 2023-03-23 DIAGNOSIS — J309 Allergic rhinitis, unspecified: Secondary | ICD-10-CM

## 2023-04-21 ENCOUNTER — Other Ambulatory Visit: Payer: Self-pay | Admitting: Nurse Practitioner

## 2023-04-21 DIAGNOSIS — E039 Hypothyroidism, unspecified: Secondary | ICD-10-CM

## 2023-04-22 ENCOUNTER — Ambulatory Visit (INDEPENDENT_AMBULATORY_CARE_PROVIDER_SITE_OTHER): Payer: BC Managed Care – PPO

## 2023-04-22 DIAGNOSIS — J309 Allergic rhinitis, unspecified: Secondary | ICD-10-CM | POA: Diagnosis not present

## 2023-05-03 ENCOUNTER — Ambulatory Visit
Admission: RE | Admit: 2023-05-03 | Discharge: 2023-05-03 | Disposition: A | Discharge status: Home / Self Care | Payer: BC Managed Care – PPO | Source: Ambulatory Visit | Attending: Nurse Practitioner

## 2023-05-03 DIAGNOSIS — E039 Hypothyroidism, unspecified: Secondary | ICD-10-CM

## 2023-05-18 ENCOUNTER — Ambulatory Visit (INDEPENDENT_AMBULATORY_CARE_PROVIDER_SITE_OTHER): Payer: BC Managed Care – PPO | Admitting: *Deleted

## 2023-05-18 DIAGNOSIS — J309 Allergic rhinitis, unspecified: Secondary | ICD-10-CM | POA: Diagnosis not present

## 2023-06-17 NOTE — Progress Notes (Signed)
VIALS EXP 08-07-23

## 2023-06-21 DIAGNOSIS — J3089 Other allergic rhinitis: Secondary | ICD-10-CM | POA: Diagnosis not present

## 2023-06-24 ENCOUNTER — Ambulatory Visit (INDEPENDENT_AMBULATORY_CARE_PROVIDER_SITE_OTHER): Payer: BC Managed Care – PPO | Admitting: *Deleted

## 2023-06-24 DIAGNOSIS — J309 Allergic rhinitis, unspecified: Secondary | ICD-10-CM | POA: Diagnosis not present

## 2023-08-19 ENCOUNTER — Ambulatory Visit (INDEPENDENT_AMBULATORY_CARE_PROVIDER_SITE_OTHER): Payer: 59 | Admitting: *Deleted

## 2023-08-19 DIAGNOSIS — J309 Allergic rhinitis, unspecified: Secondary | ICD-10-CM | POA: Diagnosis not present

## 2023-08-26 ENCOUNTER — Ambulatory Visit (INDEPENDENT_AMBULATORY_CARE_PROVIDER_SITE_OTHER): Payer: Self-pay | Admitting: *Deleted

## 2023-08-26 DIAGNOSIS — J309 Allergic rhinitis, unspecified: Secondary | ICD-10-CM

## 2023-09-06 ENCOUNTER — Ambulatory Visit (INDEPENDENT_AMBULATORY_CARE_PROVIDER_SITE_OTHER): Payer: Self-pay

## 2023-09-06 DIAGNOSIS — J309 Allergic rhinitis, unspecified: Secondary | ICD-10-CM

## 2023-09-14 ENCOUNTER — Ambulatory Visit (INDEPENDENT_AMBULATORY_CARE_PROVIDER_SITE_OTHER): Payer: Self-pay

## 2023-09-14 DIAGNOSIS — J309 Allergic rhinitis, unspecified: Secondary | ICD-10-CM | POA: Diagnosis not present

## 2023-10-01 ENCOUNTER — Ambulatory Visit (INDEPENDENT_AMBULATORY_CARE_PROVIDER_SITE_OTHER): Payer: Self-pay

## 2023-10-01 DIAGNOSIS — J309 Allergic rhinitis, unspecified: Secondary | ICD-10-CM

## 2023-10-07 ENCOUNTER — Ambulatory Visit (INDEPENDENT_AMBULATORY_CARE_PROVIDER_SITE_OTHER): Payer: Self-pay

## 2023-10-07 DIAGNOSIS — J309 Allergic rhinitis, unspecified: Secondary | ICD-10-CM | POA: Diagnosis not present

## 2023-11-19 ENCOUNTER — Other Ambulatory Visit: Payer: Self-pay

## 2023-11-19 ENCOUNTER — Telehealth: Payer: Self-pay | Admitting: Allergy and Immunology

## 2023-11-19 MED ORDER — EPINEPHRINE 0.3 MG/0.3ML IJ SOAJ: 0.3000 mg | INTRAMUSCULAR | 1 refills | Status: DC | PRN

## 2023-11-19 NOTE — Telephone Encounter (Signed)
 Brittany Thornton called and scheduled an office visit to get her epi pen re prescribed. She asked if it could done before her visit.

## 2023-11-19 NOTE — Telephone Encounter (Signed)
 Patient called back and verified pharmacy. Epipen  has been sent in.

## 2023-11-19 NOTE — Telephone Encounter (Addendum)
 LVM requesting a callback to confirm pharmacy before epipen  will be sent in.

## 2023-11-30 ENCOUNTER — Ambulatory Visit: Admitting: Allergy and Immunology

## 2023-11-30 VITALS — BP 102/70 | HR 73 | Temp 98.2°F | Resp 14 | Ht 63.0 in | Wt 157.6 lb

## 2023-11-30 DIAGNOSIS — J3089 Other allergic rhinitis: Secondary | ICD-10-CM

## 2023-11-30 DIAGNOSIS — G4720 Circadian rhythm sleep disorder, unspecified type: Secondary | ICD-10-CM

## 2023-11-30 DIAGNOSIS — T781XXD Other adverse food reactions, not elsewhere classified, subsequent encounter: Secondary | ICD-10-CM

## 2023-11-30 DIAGNOSIS — J4530 Mild persistent asthma, uncomplicated: Secondary | ICD-10-CM

## 2023-11-30 DIAGNOSIS — J301 Allergic rhinitis due to pollen: Secondary | ICD-10-CM

## 2023-11-30 DIAGNOSIS — K219 Gastro-esophageal reflux disease without esophagitis: Secondary | ICD-10-CM

## 2023-11-30 MED ORDER — CETIRIZINE HCL 10 MG PO TABS: 10.0000 mg | ORAL_TABLET | Freq: Two times a day (BID) | ORAL | 11 refills | Status: DC | PRN

## 2023-11-30 MED ORDER — AIRSUPRA 90-80 MCG/ACT IN AERO: 2.0000 | INHALATION_SPRAY | RESPIRATORY_TRACT | 1 refills | Status: DC | PRN

## 2023-11-30 MED ORDER — ASMANEX (60 METERED DOSES) 220 MCG/ACT IN AEPB: 1.0000 | INHALATION_SPRAY | Freq: Every morning | RESPIRATORY_TRACT | 1 refills | Status: DC

## 2023-11-30 NOTE — Patient Instructions (Addendum)
  1. Start Asmanex  220 - 1 inhalation 1 time per day (empty lungs)  2. If needed:   A.  Zyrtec  / Xyzal   B.  Airsupra - 2 inhalations every 4-6 hours (Coupon)  C.  Mometasone  0.1% Cream  D.  Omeprazole  40 mg - 1 tablet 1 time per day  E.  Epi-Pen  3. Treat sleep dysfunction:   A. Hydroxyzine 50 mg at bedtime  4.  Return in 4 weeks or earlier if problem.   5. Restart immunotherapy???

## 2023-11-30 NOTE — Progress Notes (Signed)
 [place] - [place] - [place] - [place] [facility]   Follow-up Note  Referring Provider: Sun, Vyvyan, MD Primary Provider: Sun, Vyvyan, MD Date of Office Visit: 11/30/2023  Subjective:   Ihor Makua (DOB: 06-25-1976) is a 48 y.o. female who [facility] in re-evaluation of the following:  HPI: Jadah returns to this clinic in evaluation of asthma, allergic rhinoconjunctivitis, oral allergy syndrome, atopic dermatitis, sleep dysfunction, reflux.  I last saw in this clinic 04 November 2021.  She was doing wonderful since I have last seen her while utilizing immunotherapy to control her multiorgan atopic disease and pretty much stopping most of her other anti-inflammatory medications.  Unfortunately, about 6 weeks ago she developed a cough and some wheezing and she was given a Z-Pak and some Tessalon and she may be somewhat better yet still continues to have some cough.  There is very little upper airway issue associated with this cough and there is no issues tied up with the reflux as far she can tell with this cough.  She thinks that her cough was precipitated by outdoor exposure.  She did restart her albuterol which helps transiently.  She has not really had any issues with her skin.  This only occurs during the summer when she goes to the beach and has extensive sun exposure on his forearms.  She does have some mometasone  to use should be required.  When I last saw her in this clinic she was having sleep dysfunction and we gave her Periactin  and that worked great.  She is now using hydroxyzine in place of Periactin  prescribed by her primary care doctor which is also working very well.  She still continues to avoid all foods that give rise to oral itching.  Allergies as of 11/30/2023       Reactions   Codeine Nausea And Vomiting, Other (See Comments)   Hallucinations    Other Itching, Swelling   Oral pollinosis - fruit    Oxycodone  Nausea And Vomiting, Other (See Comments)   Hallucinations    Latex Other (See Comments)   Advised by MD to add as an allergy due to severe sensitive skin         Medication List    BORIC ACID EX Take 600 mg by mouth once a week.   cetirizine  10 MG tablet Commonly known as: ZYRTEC  Take 1 tablet (10 mg total) by mouth 2 (two) times daily as needed for allergies (Can take an extra dose during flare ups.).   Cholecalciferol 125 MCG (5000 UT) Tabs Take 5,000 Units by mouth daily.   cyproheptadine  4 MG tablet Commonly known as: PERIACTIN  TAKE 1 TABLET BY MOUTH AT BEDTIME. CAN TAKE 1/2 TABLET IF YOU FEEL THE FULL DOSE IS NOT NEEDED   EPINEPHrine  0.3 mg/0.3 mL Soaj injection Commonly known as: Auvi-Q  Inject 0.3 mg into the muscle as needed for anaphylaxis.   famotidine  40 MG tablet Commonly known as: PEPCID  Take 1 tablet daily in the afternoon   fluticasone  110 MCG/ACT inhaler Commonly known as: Flovent  HFA Inhale 2 puffs into the lungs 3 (three) times daily as needed.   hydrOXYzine 25 MG tablet Commonly known as: ATARAX Take 25 mg by mouth 2 (two) times daily as needed.   levalbuterol  45 MCG/ACT inhaler Commonly known as: Xopenex  HFA Inhale 1-2 puffs into the lungs every 6 (six) hours as needed for wheezing.   levonorgestrel-ethinyl estradiol 90-20 MCG tablet Commonly known as: LYBREL Take  1 tablet by mouth daily.   levothyroxine 75 MCG tablet Commonly known as: SYNTHROID Take 75 mcg by mouth daily before breakfast.   MAGNESIUM PO Take 1 capsule by mouth daily.   mometasone  0.1 % cream Commonly known as: ELOCON  Apply topically 2 (two) times daily as needed. APPLY TO AFFECTED AREA(S) 1 TO 2 TIMES PER DAY AS NEEDED   multivitamin tablet Take 1 tablet by mouth daily.   omeprazole  40 MG capsule Commonly known as: PRILOSEC Take 1 capsule (40 mg total) by mouth daily.   OVER THE COUNTER MEDICATION Take 1 capsule by mouth daily. Olly Supplement    Probiotic 250 MG Caps Take 250 mg by mouth daily.   Qvar  RediHaler 40 MCG/ACT inhaler Generic drug: beclomethasone Inhale 2 puffs into the lungs daily.   SLOW FE PO Take 1 capsule by mouth daily.   Turmeric 500 MG Caps Take 500 mg by mouth daily.   V-R GAS RELIEF 80 MG chewable tablet Generic drug: simethicone  Chew 1 tablet (80 mg total) by mouth 4 (four) times daily as needed for flatulence.    Past Medical History:  Diagnosis Date   Abnormal Pap smear    ASCUS   Anemia    Asthma    Dizziness 2005   Dizziness 2005   Dyspareunia    Elevated prolactin level 2004   Galactorrhea 2004   Hematuria    History of kidney stones    Nausea 2005   Rectal itching    SOB (shortness of breath) 2005   Vaginal burning 2006    Past Surgical History:  Procedure Laterality Date   CYSTOSCOPY N/A 01/26/2022   Procedure: CYSTOSCOPY;  Surgeon: Artemisa Bile, MD;  Location: [facility];  Service: Gynecology;  Laterality: N/A;   DILATION AND CURETTAGE OF UTERUS     TOTAL LAPAROSCOPIC HYSTERECTOMY WITH SALPINGECTOMY N/A 01/26/2022   Procedure: TOTAL LAPAROSCOPIC HYSTERECTOMY WITH SALPINGECTOMY;  Surgeon: Artemisa Bile, MD;  Location: [facility] OR;  Service: Gynecology;  Laterality: N/A;    Review of systems negative except as noted in HPI / PMHx or noted below:  Review of Systems  Constitutional: Negative.   HENT: Negative.    Eyes: Negative.   Respiratory: Negative.    Cardiovascular: Negative.   Gastrointestinal: Negative.   Genitourinary: Negative.   Musculoskeletal: Negative.   Skin: Negative.   Neurological: Negative.   Endo/Heme/Allergies: Negative.   Psychiatric/Behavioral: Negative.       Objective:   Vitals:   11/30/23 1345  BP: 102/70  Pulse: 73  Resp: 14  Temp: 98.2 F (36.8 C)  SpO2: 99%   Height: 5\' 3"  (160 cm)  Weight: 157 lb 9.6 oz (71.5 kg)   Physical Exam Constitutional:      Appearance: She is not diaphoretic.  HENT:     Head: Normocephalic.     Right Ear:  Tympanic membrane, ear canal and external ear normal.     Left Ear: Tympanic membrane, ear canal and external ear normal.     Nose: Nose normal. No mucosal edema or rhinorrhea.     Mouth/Throat:     Pharynx: Uvula midline. No oropharyngeal exudate.  Eyes:     Conjunctiva/sclera: Conjunctivae normal.  Neck:     Thyroid : No thyromegaly.     Trachea: Trachea normal. No tracheal tenderness or tracheal deviation.  Cardiovascular:     Rate and Rhythm: Normal rate and regular rhythm.     Heart sounds: Normal heart sounds, S1 normal and S2 normal. No murmur heard.  Pulmonary:     Effort: No respiratory distress.     Breath sounds: Normal breath sounds. No stridor. No wheezing or rales.  Lymphadenopathy:     Head:     Right side of head: No tonsillar adenopathy.     Left side of head: No tonsillar adenopathy.     Cervical: No cervical adenopathy.  Skin:    Findings: No erythema or rash.     Nails: There is no clubbing.  Neurological:     Mental Status: She is alert.     Diagnostics: Spirometry was performed and demonstrated an FEV1 of 1.95 at 77 % of predicted.  Assessment and Plan:   1. Not well controlled mild persistent asthma   2. Perennial allergic rhinitis   3. Seasonal allergic rhinitis due to pollen   4. Pollen-food allergy, subsequent encounter   5. Gastroesophageal reflux disease without esophagitis   6. Dysfunction of sleep stage or arousal     1. Start Asmanex  220 - 1 inhalation 1 time per day (empty lungs)  2. If needed:   A.  Zyrtec  / Xyzal   B.  Airsupra - 2 inhalations every 4-6 hours (Coupon)  C.  Mometasone  0.1% Cream  D.  Omeprazole  40 mg - 1 tablet 1 time per day  E.  Epi-pen  3. Treat sleep dysfunction:   A. Hydroxyzine 50 mg at bedtime  4.  Return in 4 weeks or earlier if problem.   5. Restart immunotherapy???  Lilliane appears to be doing pretty well other than the fact that she developed a cough for the past 6 weeks which is probably related to  pollen exposure and we will start her on inhaled steroid for the next 4 weeks and see her back in this clinic at that point time to make sure that she response to this treatment.  There is a selection of agents that she can utilize should they be required such as mometasone  for her sun induced inflammatory dermatitis, omeprazole  for reflux, EpiPen  for her oral allergy syndrome, and she can continue on hydroxyzine for sleep dysfunction.  I will see her back in this clinic in 4 weeks to assess her response to inhaled steroid administration.  Schuyler Custard, MD Allergy / Immunology [facility]

## 2023-12-01 ENCOUNTER — Telehealth: Payer: Self-pay

## 2023-12-01 ENCOUNTER — Encounter: Payer: Self-pay | Admitting: Allergy and Immunology

## 2023-12-01 ENCOUNTER — Other Ambulatory Visit (HOSPITAL_COMMUNITY): Payer: Self-pay

## 2023-12-01 NOTE — Telephone Encounter (Signed)
*  Asthma/Allergy  Pharmacy Patient Advocate Encounter   Received notification from CoverMyMeds that prior authorization for Asmanex  (60 Metered Doses) 220MCG/ACT aerosol powder  is required/requested.   Insurance verification completed.   The patient is insured through [facility] .   Per test claim:  Asmanex  HFA or Pulmicort Flexhaler is preferred by the insurance.  If suggested medication is appropriate, Please send in a new RX and discontinue this one. If not, please advise as to why it's not appropriate so that we may request a Prior Authorization. Please note, some preferred medications may still require a PA.  If the suggested medications have not been trialed and there are no contraindications to their use, the PA will not be submitted, as it will not be approved.   CMM Key: [id number]

## 2023-12-03 ENCOUNTER — Telehealth: Payer: Self-pay | Admitting: Allergy and Immunology

## 2023-12-03 MED ORDER — MOMETASONE FUROATE 0.1 % EX CREA: TOPICAL_CREAM | Freq: Two times a day (BID) | CUTANEOUS | 11 refills | Status: DC | PRN

## 2023-12-03 MED ORDER — CETIRIZINE HCL 10 MG PO TABS: 10.0000 mg | ORAL_TABLET | Freq: Two times a day (BID) | ORAL | 11 refills | Status: DC | PRN

## 2023-12-03 MED ORDER — EPINEPHRINE 0.3 MG/0.3ML IJ SOAJ: 0.3000 mg | INTRAMUSCULAR | 1 refills | Status: DC | PRN

## 2023-12-03 MED ORDER — HYDROXYZINE HCL 50 MG PO TABS: 50.0000 mg | ORAL_TABLET | Freq: Every evening | ORAL | 11 refills | Status: DC

## 2023-12-03 MED ORDER — ASMANEX (60 METERED DOSES) 220 MCG/ACT IN AEPB: 1.0000 | INHALATION_SPRAY | Freq: Every morning | RESPIRATORY_TRACT | 1 refills | Status: DC

## 2023-12-03 MED ORDER — OMEPRAZOLE 40 MG PO CPDR
40.0000 mg | DELAYED_RELEASE_CAPSULE | Freq: Every day | ORAL | 11 refills | Status: AC
Start: 2023-12-03 — End: ?

## 2023-12-03 MED ORDER — AIRSUPRA 90-80 MCG/ACT IN AERO: 2.0000 | INHALATION_SPRAY | RESPIRATORY_TRACT | 1 refills | Status: DC | PRN

## 2023-12-03 NOTE — Telephone Encounter (Signed)
 I called the patient and informed prescriptions have been sent to [facility] on [address]

## 2023-12-03 NOTE — Telephone Encounter (Signed)
 Brittany Thornton called and asked that her pharmacy be updated for all future prescriptions. She would like to change it to the [facility] on [address].

## 2023-12-09 ENCOUNTER — Ambulatory Visit (INDEPENDENT_AMBULATORY_CARE_PROVIDER_SITE_OTHER): Payer: Self-pay

## 2023-12-09 DIAGNOSIS — J309 Allergic rhinitis, unspecified: Secondary | ICD-10-CM

## 2023-12-09 NOTE — Telephone Encounter (Signed)
 No response from office, closing request.

## 2023-12-14 ENCOUNTER — Ambulatory Visit (INDEPENDENT_AMBULATORY_CARE_PROVIDER_SITE_OTHER): Payer: Self-pay

## 2023-12-14 DIAGNOSIS — J309 Allergic rhinitis, unspecified: Secondary | ICD-10-CM

## 2023-12-28 ENCOUNTER — Ambulatory Visit (INDEPENDENT_AMBULATORY_CARE_PROVIDER_SITE_OTHER)

## 2023-12-28 DIAGNOSIS — J309 Allergic rhinitis, unspecified: Secondary | ICD-10-CM | POA: Diagnosis not present

## 2024-01-02 NOTE — Progress Notes (Unsigned)
   [address] [place] [address] Dept: [phone]  FOLLOW UP NOTE  Patient ID: Brittany Thornton, female    DOB: 08-18-1975  Age: 48 y.o. MRN: [id number] Date of Office Visit: 01/03/2024  Assessment  Chief Complaint: No chief complaint on file.  HPI Brittany Thornton   Discussed the use of AI scribe software for clinical note transcription with the patient, who gave verbal consent to proceed.  History of Present Illness      Drug Allergies:  Allergies  Allergen Reactions   Codeine Nausea And Vomiting and Other (See Comments)    Hallucinations    Other Itching and Swelling    Oral pollinosis - fruit   Oxycodone  Nausea And Vomiting and Other (See Comments)    Hallucinations    Latex Other (See Comments)    Advised by MD to add as an allergy due to severe sensitive skin     Physical Exam: There were no vitals taken for this visit.   Physical Exam  Diagnostics:    Assessment and Plan: No diagnosis found.  No orders of the defined types were placed in this encounter.   There are no Patient Instructions on file for this visit.  No follow-ups on file.    Thank you for the opportunity to care for this patient.  Please do not hesitate to contact me with questions.  Arlean Mutter, FNP [facility]

## 2024-01-02 NOTE — Patient Instructions (Incomplete)
  1. Start Asmanex  220 - 1 inhalation 1 time per day (empty lungs)  2. If needed:   A.  Zyrtec  / Xyzal   B.  Airsupra  - 2 inhalations every 4-6 hours (Coupon)  C.  Mometasone  0.1% Cream  D.  Omeprazole  40 mg - 1 tablet 1 time per day  E.  Epi-Pen  3. Treat sleep dysfunction:   A. Hydroxyzine  50 mg at bedtime  4.  Return in 4 weeks or earlier if problem.   5. Restart immunotherapy???

## 2024-01-03 ENCOUNTER — Other Ambulatory Visit: Payer: Self-pay

## 2024-01-03 ENCOUNTER — Ambulatory Visit: Payer: Self-pay

## 2024-01-03 ENCOUNTER — Ambulatory Visit: Admitting: Family Medicine

## 2024-01-03 ENCOUNTER — Encounter: Payer: Self-pay | Admitting: Family Medicine

## 2024-01-03 VITALS — BP 100/68 | HR 72 | Temp 98.2°F | Resp 16

## 2024-01-03 DIAGNOSIS — K219 Gastro-esophageal reflux disease without esophagitis: Secondary | ICD-10-CM | POA: Diagnosis not present

## 2024-01-03 DIAGNOSIS — J3089 Other allergic rhinitis: Secondary | ICD-10-CM

## 2024-01-03 DIAGNOSIS — J4540 Moderate persistent asthma, uncomplicated: Secondary | ICD-10-CM

## 2024-01-03 DIAGNOSIS — G4720 Circadian rhythm sleep disorder, unspecified type: Secondary | ICD-10-CM | POA: Insufficient documentation

## 2024-01-03 DIAGNOSIS — J302 Other seasonal allergic rhinitis: Secondary | ICD-10-CM | POA: Insufficient documentation

## 2024-01-03 DIAGNOSIS — L2084 Intrinsic (allergic) eczema: Secondary | ICD-10-CM | POA: Insufficient documentation

## 2024-01-03 DIAGNOSIS — J4530 Mild persistent asthma, uncomplicated: Secondary | ICD-10-CM | POA: Insufficient documentation

## 2024-01-03 MED ORDER — ASMANEX (60 METERED DOSES) 220 MCG/ACT IN AEPB
1.0000 | INHALATION_SPRAY | Freq: Every morning | RESPIRATORY_TRACT | 1 refills | Status: AC
Start: 2024-01-03 — End: ?

## 2024-01-07 ENCOUNTER — Telehealth: Payer: Self-pay

## 2024-01-07 MED ORDER — PULMICORT FLEXHALER 180 MCG/ACT IN AEPB: 2.0000 | INHALATION_SPRAY | Freq: Two times a day (BID) | RESPIRATORY_TRACT | 1 refills | Status: DC

## 2024-01-07 NOTE — Telephone Encounter (Signed)
 Called and left a message for patient to call our office back to inform her of the inhaler change and dosages.

## 2024-01-07 NOTE — Addendum Note (Signed)
 Addended by: FRANCIS ROULEAU A on: 01/07/2024 03:06 PM   Modules accepted: Orders

## 2024-01-07 NOTE — Telephone Encounter (Signed)
*  Asthma/Allergy   Pharmacy Patient Advocate Encounter   Received notification from CoverMyMeds that prior authorization for Asmanex  (60 Metered Doses) 220MCG/ACT aerosol powder  is required/requested.   Insurance verification completed.   The patient is insured through [facility] .   Per test claim:  Asmanex  HFA or Pulmicort Flexhaler is preferred by the insurance.  If suggested medication is appropriate, Please send in a new RX and discontinue this one. If not, please advise as to why it's not appropriate so that we may request a Prior Authorization. Please note, some preferred medications may still require a PA.  If the suggested medications have not been trialed and there are no contraindications to their use, the PA will not be submitted, as it will not be approved.

## 2024-01-07 NOTE — Telephone Encounter (Signed)
 Pulmicort flexhaler 180- 2 puffs twice a day. Please call the patient and let her know of the change. Thank you

## 2024-01-07 NOTE — Telephone Encounter (Signed)
 Please specify what inhaler to switch to and dosage for either Asmanex  HFA or Pulmicort Flexhaler is preferred by the insurance.  Ash Asmanex  220 was not covered. Thank you!

## 2024-01-11 ENCOUNTER — Ambulatory Visit (INDEPENDENT_AMBULATORY_CARE_PROVIDER_SITE_OTHER)

## 2024-01-11 DIAGNOSIS — J309 Allergic rhinitis, unspecified: Secondary | ICD-10-CM

## 2024-01-11 MED ORDER — PULMICORT FLEXHALER 180 MCG/ACT IN AEPB
2.0000 | INHALATION_SPRAY | Freq: Two times a day (BID) | RESPIRATORY_TRACT | 1 refills | Status: AC
Start: 2024-01-11 — End: ?

## 2024-01-11 NOTE — Addendum Note (Signed)
 Addended by: Jasani Dolney N on: 01/11/2024 05:50 PM   Modules accepted: Orders

## 2024-01-20 ENCOUNTER — Ambulatory Visit

## 2024-01-20 DIAGNOSIS — J309 Allergic rhinitis, unspecified: Secondary | ICD-10-CM

## 2024-02-24 ENCOUNTER — Ambulatory Visit (INDEPENDENT_AMBULATORY_CARE_PROVIDER_SITE_OTHER)

## 2024-02-24 DIAGNOSIS — J309 Allergic rhinitis, unspecified: Secondary | ICD-10-CM

## 2024-03-11 ENCOUNTER — Other Ambulatory Visit (HOSPITAL_COMMUNITY): Payer: Self-pay

## 2024-04-04 ENCOUNTER — Ambulatory Visit (INDEPENDENT_AMBULATORY_CARE_PROVIDER_SITE_OTHER)

## 2024-04-04 ENCOUNTER — Ambulatory Visit: Admitting: Allergy and Immunology

## 2024-04-04 DIAGNOSIS — J309 Allergic rhinitis, unspecified: Secondary | ICD-10-CM

## 2024-05-02 ENCOUNTER — Ambulatory Visit

## 2024-05-02 DIAGNOSIS — J309 Allergic rhinitis, unspecified: Secondary | ICD-10-CM

## 2024-05-15 DIAGNOSIS — J301 Allergic rhinitis due to pollen: Secondary | ICD-10-CM | POA: Diagnosis not present

## 2024-05-15 DIAGNOSIS — J3081 Allergic rhinitis due to animal (cat) (dog) hair and dander: Secondary | ICD-10-CM | POA: Diagnosis not present

## 2024-05-15 DIAGNOSIS — J3089 Other allergic rhinitis: Secondary | ICD-10-CM | POA: Diagnosis not present

## 2024-05-15 DIAGNOSIS — J302 Other seasonal allergic rhinitis: Secondary | ICD-10-CM | POA: Diagnosis not present

## 2024-05-15 NOTE — Progress Notes (Signed)
 VIALS MADE ON 05/15/24

## 2024-06-06 ENCOUNTER — Ambulatory Visit: Admitting: Allergy and Immunology

## 2024-07-04 ENCOUNTER — Encounter: Payer: Self-pay | Admitting: Allergy and Immunology

## 2024-07-04 ENCOUNTER — Ambulatory Visit: Admitting: Allergy and Immunology

## 2024-07-04 ENCOUNTER — Other Ambulatory Visit: Payer: Self-pay

## 2024-07-04 ENCOUNTER — Ambulatory Visit

## 2024-07-04 VITALS — BP 114/70 | HR 78 | Temp 98.1°F | Resp 16 | Wt 165.7 lb

## 2024-07-04 DIAGNOSIS — L2084 Intrinsic (allergic) eczema: Secondary | ICD-10-CM

## 2024-07-04 DIAGNOSIS — G4720 Circadian rhythm sleep disorder, unspecified type: Secondary | ICD-10-CM

## 2024-07-04 DIAGNOSIS — J4540 Moderate persistent asthma, uncomplicated: Secondary | ICD-10-CM

## 2024-07-04 DIAGNOSIS — J309 Allergic rhinitis, unspecified: Secondary | ICD-10-CM | POA: Diagnosis not present

## 2024-07-04 DIAGNOSIS — J3089 Other allergic rhinitis: Secondary | ICD-10-CM | POA: Diagnosis not present

## 2024-07-04 DIAGNOSIS — T7819XD Other adverse food reactions, not elsewhere classified, subsequent encounter: Secondary | ICD-10-CM | POA: Diagnosis not present

## 2024-07-04 DIAGNOSIS — N3000 Acute cystitis without hematuria: Secondary | ICD-10-CM

## 2024-07-04 DIAGNOSIS — J301 Allergic rhinitis due to pollen: Secondary | ICD-10-CM | POA: Diagnosis not present

## 2024-07-04 MED ORDER — CETIRIZINE HCL 10 MG PO TABS: 10.0000 mg | ORAL_TABLET | Freq: Every day | ORAL | 1 refills | Status: AC | PRN

## 2024-07-04 MED ORDER — CIPROFLOXACIN HCL 500 MG PO TABS: 500.0000 mg | ORAL_TABLET | Freq: Two times a day (BID) | ORAL | 0 refills | Status: AC

## 2024-07-04 MED ORDER — EPINEPHRINE 0.3 MG/0.3ML IJ SOAJ: 0.3000 mg | INTRAMUSCULAR | 1 refills | Status: AC | PRN

## 2024-07-04 MED ORDER — AIRSUPRA 90-80 MCG/ACT IN AERO: 2.0000 | INHALATION_SPRAY | RESPIRATORY_TRACT | 1 refills | Status: AC | PRN

## 2024-07-04 MED ORDER — MOMETASONE FUROATE 0.1 % EX CREA: TOPICAL_CREAM | Freq: Two times a day (BID) | CUTANEOUS | 2 refills | Status: AC | PRN

## 2024-07-04 MED ORDER — HYDROXYZINE HCL 50 MG PO TABS: 50.0000 mg | ORAL_TABLET | Freq: Every evening | ORAL | 1 refills | Status: AC

## 2024-07-04 NOTE — Progress Notes (Signed)
 "   - [place] - [place] - [place] [facility]   Follow-up Note  Referring Provider: Sun, Vyvyan, MD Primary Provider: Sun, Vyvyan, MD Date of Office Visit: 07/04/2024  Subjective:   Brittany Thornton (DOB: 1976-05-25) is a 48 y.o. female who [facility] 07[facility]24 in re-evaluation of the following:  HPI: Brittany Thornton returns to this clinic in evaluation of asthma, allergic rhinoconjunctivitis, oral allergy syndrome, atopic dermatitis, sleep dysfunction, reflux.  I last saw her in this clinic 30 Nov 2023.  She has really done very well regarding her airway issue.  Rarely does she use Airsupra  averaging out to less than 1 time per week.  She does not use any other controller agent at this point in time.  She has not required a systemic steroid to treat an exacerbation of asthma.  She can exercise without any problem and have cold air exposure without any problem.  And she has had very little problems with her upper airways.  She has not required an antibiotic to treat an episode of sinusitis.  She is currently using immunotherapy at every 4-week administration.  She has not had any problems with her dermatitis involving her arms that appears to be precipitated by sunlight exposure while at the beach.  She rarely uses any topical mometasone .  She continues to use hydroxyzine  for her sleep dysfunction.  This is prescribed by her counselor but she is asking us  to renew this agent.  She recently had a urinary tract infection and it sounds as though she was treated with Macrobid or possibly Bactrim and she still has the symptoms of urinary tract infection.  She just finished that antibiotic 2 days ago.  She is asking us  to give her another antibiotic.  I looked at the sensitivities on culture and it looks like the organism is sensitive to ciprofloxacin .  Allergies as of 07/04/2024       Reactions   Codeine Nausea And Vomiting, Other (See Comments)    Hallucinations    Other Itching, Swelling   Oral pollinosis - fruit   Oxycodone  Nausea And Vomiting, Other (See Comments)   Hallucinations    Latex Other (See Comments)   Advised by MD to add as an allergy due to severe sensitive skin         Medication List    Airsupra  90-80 MCG/ACT Aero Generic drug: Albuterol-Budesonide  Inhale 2 Inhalations into the lungs every 4 (four) hours as needed.   BORIC ACID EX Take 600 mg by mouth once a week.   cetirizine  10 MG tablet Commonly known as: ZYRTEC  Take 1 tablet (10 mg total) by mouth 2 (two) times daily as needed for allergies (Can take an extra dose during flare ups.).   Cholecalciferol 125 MCG (5000 UT) Tabs Take 5,000 Units by mouth daily.   cyproheptadine  4 MG tablet Commonly known as: PERIACTIN  TAKE 1 TABLET BY MOUTH AT BEDTIME. CAN TAKE 1/2 TABLET IF YOU FEEL THE FULL DOSE IS NOT NEEDED   EPINEPHrine  0.3 mg/0.3 mL Soaj injection Commonly known as: Auvi-Q  Inject 0.3 mg into the muscle as needed for anaphylaxis.   hydrOXYzine  50 MG tablet Commonly known as: ATARAX  Take 1 tablet (50 mg total) by mouth at bedtime.   levonorgestrel-ethinyl estradiol 90-20 MCG tablet Commonly known as: LYBREL Take 1 tablet by mouth daily.   levothyroxine 75 MCG tablet Commonly known as: SYNTHROID Take 75 mcg by mouth daily before breakfast.   MAGNESIUM PO Take 1 capsule  by mouth daily.   mometasone  0.1 % cream Commonly known as: ELOCON  Apply topically 2 (two) times daily as needed.   multivitamin tablet Take 1 tablet by mouth daily.   OVER THE COUNTER MEDICATION Take 1 capsule by mouth daily. Olly Supplement   Probiotic 250 MG Caps Take 250 mg by mouth daily.   SLOW FE PO Take 1 capsule by mouth daily.   Turmeric 500 MG Caps Take 500 mg by mouth daily.   V-R GAS RELIEF 80 MG chewable tablet Generic drug: simethicone  Chew 1 tablet (80 mg total) by mouth 4 (four) times daily as needed for flatulence.    Past Medical  History:  Diagnosis Date   Abnormal Pap smear    ASCUS   Anemia    Asthma    Dizziness 2005   Dizziness 2005   Dyspareunia    Elevated prolactin level 2004   Galactorrhea 2004   Hematuria    History of kidney stones    Intrinsic atopic dermatitis 01/03/2024   Nausea 2005   Rectal itching    SOB (shortness of breath) 2005   Vaginal burning 2006    Past Surgical History:  Procedure Laterality Date   CYSTOSCOPY N/A 01/26/2022   Procedure: CYSTOSCOPY;  Surgeon: Bettina Muskrat, MD;  Location: [facility];  Service: Gynecology;  Laterality: N/A;   DILATION AND CURETTAGE OF UTERUS     TOTAL LAPAROSCOPIC HYSTERECTOMY WITH SALPINGECTOMY N/A 01/26/2022   Procedure: TOTAL LAPAROSCOPIC HYSTERECTOMY WITH SALPINGECTOMY;  Surgeon: Bettina Muskrat, MD;  Location: [facility];  Service: Gynecology;  Laterality: N/A;    Review of systems negative except as noted in HPI / PMHx or noted below:  Review of Systems  Constitutional: Negative.   HENT: Negative.    Eyes: Negative.   Respiratory: Negative.    Cardiovascular: Negative.   Gastrointestinal: Negative.   Genitourinary: Negative.   Musculoskeletal: Negative.   Skin: Negative.   Neurological: Negative.   Endo/Heme/Allergies: Negative.   Psychiatric/Behavioral: Negative.       Objective:   Vitals:   07/04/24 1621  BP: 114/70  Pulse: 78  Resp: 16  Temp: 98.1 F (36.7 C)  SpO2: 99%      Weight: 165 lb 11.2 oz (75.2 kg)   Physical Exam Constitutional:      Appearance: She is not diaphoretic.  HENT:     Head: Normocephalic.     Right Ear: Tympanic membrane, ear canal and external ear normal.     Left Ear: Tympanic membrane, ear canal and external ear normal.     Nose: Nose normal. No mucosal edema or rhinorrhea.     Mouth/Throat:     Pharynx: Uvula midline. No oropharyngeal exudate.  Eyes:     Conjunctiva/sclera: Conjunctivae normal.  Neck:     Thyroid : No thyromegaly.     Trachea: Trachea normal. No tracheal tenderness or tracheal  deviation.  Cardiovascular:     Rate and Rhythm: Normal rate and regular rhythm.     Heart sounds: Normal heart sounds, S1 normal and S2 normal. No murmur heard. Pulmonary:     Effort: No respiratory distress.     Breath sounds: Normal breath sounds. No stridor. No wheezing or rales.  Lymphadenopathy:     Head:     Right side of head: No tonsillar adenopathy.     Left side of head: No tonsillar adenopathy.     Cervical: No cervical adenopathy.  Skin:    Findings: No erythema or rash.  Nails: There is no clubbing.  Neurological:     Mental Status: She is alert.     Diagnostics: Spirometry was performed and demonstrated an FEV1 of 1.82 at 73 % of predicted.  Assessment and Plan:   1. Moderate persistent asthma without complication   2. Pollen-food allergy, subsequent encounter   3. Perennial allergic rhinitis   4. Seasonal allergic rhinitis due to pollen   5. Dysfunction of sleep stage or arousal   6. Intrinsic atopic dermatitis   7. Acute cystitis without hematuria     1. Continue immunotherapy   2.  If needed:   A.  Zyrtec  / Cetirizine    B.  Airsupra  - 2 inhalations every 4-6 hours   C.  Mometasone  0.1% Cream  D.  Epi-Pen  3. Treat sleep dysfunction:   A. Hydroxyzine  50 mg at bedtime  4. Treat UTI:   A.  Ciprofloxacin  500 mg - 1 tablet 2 times per day for 5 days  5.  Return in 6 months or earlier if problem.   6. Influenza = Tamiflu. Covid = Paxlovid   Thia appears to be doing pretty well with her atopic airway issue and she will continue on immunotherapy and she has a selection of agents to be utilized for her airway should be required as noted above.  And fortunately her inflammatory dermatosis that appears to be precipitated by sunlight exposure while at the beach is also under very good control with minimal use of mometasone .  She has sleep dysfunction which is under very good control with hydroxyzine  and she has a recent urinary tract infection for  which we will give her ciprofloxacin  and if she continues to have problems with a urinary tract infection then she needs to go see her primary care doctor or revisit the urgent care.  If she does well I will see her back in this clinic in 6 months or earlier if there is a problem.   Camellia Denis, MD Allergy / Immunology [facility] "

## 2024-07-04 NOTE — Patient Instructions (Addendum)
"  °  1. Continue immunotherapy   2.  If needed:   A.  Zyrtec  / Cetirizine    B.  Airsupra  - 2 inhalations every 4-6 hours   C.  Mometasone  0.1% Cream  D.  Epi-Pen  3. Treat sleep dysfunction:   A. Hydroxyzine  50 mg at bedtime  4. Treat UTI:   A.  Ciprofloxacin  500 mg - 1 tablet 2 times per day for 5 days  5.  Return in 6 months or earlier if problem.   6. Influenza = Tamiflu. Covid = Paxlovid      "

## 2024-07-10 ENCOUNTER — Encounter: Payer: Self-pay | Admitting: Allergy and Immunology

## 2024-07-25 ENCOUNTER — Ambulatory Visit

## 2024-07-25 DIAGNOSIS — J302 Other seasonal allergic rhinitis: Secondary | ICD-10-CM | POA: Diagnosis not present

## 2024-08-03 ENCOUNTER — Ambulatory Visit

## 2024-08-03 DIAGNOSIS — J302 Other seasonal allergic rhinitis: Secondary | ICD-10-CM | POA: Diagnosis not present

## 2024-08-17 ENCOUNTER — Ambulatory Visit

## 2024-08-17 DIAGNOSIS — J302 Other seasonal allergic rhinitis: Secondary | ICD-10-CM

## 2025-01-02 ENCOUNTER — Ambulatory Visit: Admitting: Allergy and Immunology
